# Patient Record
Sex: Male | Born: 1950 | Race: White | Hispanic: No | Marital: Married | Smoking: Current every day smoker
Health system: Southern US, Community
[De-identification: ages and names within clinical notes are randomized; demographics above are authoritative.]

## PROBLEM LIST (undated history)

## (undated) DIAGNOSIS — Z720 Tobacco use: Secondary | ICD-10-CM

## (undated) DIAGNOSIS — F329 Major depressive disorder, single episode, unspecified: Secondary | ICD-10-CM

## (undated) DIAGNOSIS — E785 Hyperlipidemia, unspecified: Secondary | ICD-10-CM

## (undated) DIAGNOSIS — C189 Malignant neoplasm of colon, unspecified: Secondary | ICD-10-CM

## (undated) DIAGNOSIS — I10 Essential (primary) hypertension: Secondary | ICD-10-CM

## (undated) DIAGNOSIS — I639 Cerebral infarction, unspecified: Secondary | ICD-10-CM

## (undated) HISTORY — PX: COLON RESECTION: SHX5231

---
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
## 2019-02-18 DIAGNOSIS — Z720 Tobacco use: Secondary | ICD-10-CM | POA: Diagnosis not present

## 2019-02-18 DIAGNOSIS — R011 Cardiac murmur, unspecified: Secondary | ICD-10-CM | POA: Diagnosis not present

## 2019-02-18 DIAGNOSIS — I10 Essential (primary) hypertension: Secondary | ICD-10-CM | POA: Diagnosis not present

## 2019-02-18 DIAGNOSIS — R197 Diarrhea, unspecified: Secondary | ICD-10-CM

## 2019-02-18 DIAGNOSIS — C189 Malignant neoplasm of colon, unspecified: Secondary | ICD-10-CM

## 2019-02-18 DIAGNOSIS — R079 Chest pain, unspecified: Secondary | ICD-10-CM | POA: Diagnosis not present

## 2019-02-18 DIAGNOSIS — E785 Hyperlipidemia, unspecified: Secondary | ICD-10-CM

## 2019-02-18 DIAGNOSIS — R110 Nausea: Secondary | ICD-10-CM

## 2019-02-19 DIAGNOSIS — I10 Essential (primary) hypertension: Secondary | ICD-10-CM | POA: Diagnosis not present

## 2019-02-19 DIAGNOSIS — Z720 Tobacco use: Secondary | ICD-10-CM | POA: Diagnosis not present

## 2019-02-19 DIAGNOSIS — R197 Diarrhea, unspecified: Secondary | ICD-10-CM | POA: Diagnosis not present

## 2019-02-19 DIAGNOSIS — R110 Nausea: Secondary | ICD-10-CM | POA: Diagnosis not present

## 2019-03-02 DIAGNOSIS — N138 Other obstructive and reflux uropathy: Secondary | ICD-10-CM

## 2019-03-02 DIAGNOSIS — A4151 Sepsis due to Escherichia coli [E. coli]: Secondary | ICD-10-CM

## 2019-03-02 DIAGNOSIS — K810 Acute cholecystitis: Secondary | ICD-10-CM | POA: Diagnosis not present

## 2019-03-02 DIAGNOSIS — K8309 Other cholangitis: Secondary | ICD-10-CM | POA: Diagnosis not present

## 2019-03-02 DIAGNOSIS — K8000 Calculus of gallbladder with acute cholecystitis without obstruction: Secondary | ICD-10-CM

## 2019-03-03 DIAGNOSIS — A4151 Sepsis due to Escherichia coli [E. coli]: Secondary | ICD-10-CM | POA: Diagnosis not present

## 2019-03-03 DIAGNOSIS — K810 Acute cholecystitis: Secondary | ICD-10-CM | POA: Diagnosis not present

## 2019-03-03 DIAGNOSIS — K8000 Calculus of gallbladder with acute cholecystitis without obstruction: Secondary | ICD-10-CM | POA: Diagnosis not present

## 2019-03-03 DIAGNOSIS — K8309 Other cholangitis: Secondary | ICD-10-CM | POA: Diagnosis not present

## 2019-03-04 DIAGNOSIS — K8000 Calculus of gallbladder with acute cholecystitis without obstruction: Secondary | ICD-10-CM | POA: Diagnosis not present

## 2019-03-04 DIAGNOSIS — K810 Acute cholecystitis: Secondary | ICD-10-CM | POA: Diagnosis not present

## 2019-03-04 DIAGNOSIS — K8309 Other cholangitis: Secondary | ICD-10-CM | POA: Diagnosis not present

## 2019-03-04 DIAGNOSIS — A4151 Sepsis due to Escherichia coli [E. coli]: Secondary | ICD-10-CM | POA: Diagnosis not present

## 2019-03-05 DIAGNOSIS — A4151 Sepsis due to Escherichia coli [E. coli]: Secondary | ICD-10-CM | POA: Diagnosis not present

## 2019-03-05 DIAGNOSIS — K8000 Calculus of gallbladder with acute cholecystitis without obstruction: Secondary | ICD-10-CM | POA: Diagnosis not present

## 2019-03-05 DIAGNOSIS — K8309 Other cholangitis: Secondary | ICD-10-CM | POA: Diagnosis not present

## 2019-03-05 DIAGNOSIS — K810 Acute cholecystitis: Secondary | ICD-10-CM | POA: Diagnosis not present

## 2019-03-06 DIAGNOSIS — A4151 Sepsis due to Escherichia coli [E. coli]: Secondary | ICD-10-CM | POA: Diagnosis not present

## 2019-03-06 DIAGNOSIS — K810 Acute cholecystitis: Secondary | ICD-10-CM | POA: Diagnosis not present

## 2019-03-06 DIAGNOSIS — K8000 Calculus of gallbladder with acute cholecystitis without obstruction: Secondary | ICD-10-CM | POA: Diagnosis not present

## 2019-03-06 DIAGNOSIS — K8309 Other cholangitis: Secondary | ICD-10-CM | POA: Diagnosis not present

## 2019-04-16 ENCOUNTER — Other Ambulatory Visit: Payer: Self-pay

## 2019-04-16 ENCOUNTER — Emergency Department (HOSPITAL_COMMUNITY)
Admission: EM | Admit: 2019-04-16 | Discharge: 2019-04-16 | Disposition: A | Discharge status: Home / Self Care | Payer: Medicare Other | Attending: Emergency Medicine | Admitting: Emergency Medicine

## 2019-04-16 ENCOUNTER — Emergency Department (HOSPITAL_COMMUNITY): Payer: Medicare Other

## 2019-04-16 ENCOUNTER — Encounter (HOSPITAL_COMMUNITY): Payer: Self-pay | Admitting: *Deleted

## 2019-04-16 DIAGNOSIS — J449 Chronic obstructive pulmonary disease, unspecified: Secondary | ICD-10-CM | POA: Insufficient documentation

## 2019-04-16 DIAGNOSIS — Z79899 Other long term (current) drug therapy: Secondary | ICD-10-CM | POA: Insufficient documentation

## 2019-04-16 DIAGNOSIS — R05 Cough: Secondary | ICD-10-CM | POA: Diagnosis present

## 2019-04-16 DIAGNOSIS — R6883 Chills (without fever): Secondary | ICD-10-CM | POA: Diagnosis not present

## 2019-04-16 DIAGNOSIS — J069 Acute upper respiratory infection, unspecified: Secondary | ICD-10-CM | POA: Insufficient documentation

## 2019-04-16 DIAGNOSIS — B9789 Other viral agents as the cause of diseases classified elsewhere: Secondary | ICD-10-CM

## 2019-04-16 DIAGNOSIS — F17210 Nicotine dependence, cigarettes, uncomplicated: Secondary | ICD-10-CM | POA: Diagnosis not present

## 2019-04-16 DIAGNOSIS — M7918 Myalgia, other site: Secondary | ICD-10-CM | POA: Insufficient documentation

## 2019-04-16 DIAGNOSIS — Z7982 Long term (current) use of aspirin: Secondary | ICD-10-CM | POA: Insufficient documentation

## 2019-04-16 DIAGNOSIS — Z720 Tobacco use: Secondary | ICD-10-CM

## 2019-04-16 LAB — BASIC METABOLIC PANEL
Anion gap: 7 (ref 5–15)
BUN: 13 mg/dL (ref 8–23)
CO2: 23 mmol/L (ref 22–32)
Calcium: 8.8 mg/dL — ABNORMAL LOW (ref 8.9–10.3)
Chloride: 108 mmol/L (ref 98–111)
Creatinine, Ser: 1.04 mg/dL (ref 0.61–1.24)
GFR calc Af Amer: >60 mL/min (ref >60)
GFR calc non Af Amer: >60 mL/min (ref >60)
Glucose, Bld: 96 mg/dL (ref 70–99)
Potassium: 3.9 mmol/L (ref 3.5–5.1)
Sodium: 138 mmol/L (ref 135–145)

## 2019-04-16 LAB — CBC WITH DIFFERENTIAL/PLATELET
Abs Immature Granulocytes: 0.02 10*3/uL (ref 0.00–0.07)
Basophils Absolute: 0.1 10*3/uL (ref 0.0–0.1)
Basophils Relative: 1 %
Eosinophils Absolute: 0.2 10*3/uL (ref 0.0–0.5)
Eosinophils Relative: 2 %
HCT: 38.5 % — ABNORMAL LOW (ref 39.0–52.0)
Hemoglobin: 12.5 g/dL — ABNORMAL LOW (ref 13.0–17.0)
Immature Granulocytes: 0 %
Lymphocytes Relative: 31 %
Lymphs Abs: 2.8 10*3/uL (ref 0.7–4.0)
MCH: 31.6 pg (ref 26.0–34.0)
MCHC: 32.5 g/dL (ref 30.0–36.0)
MCV: 97.2 fL (ref 80.0–100.0)
Monocytes Absolute: 1.1 10*3/uL — ABNORMAL HIGH (ref 0.1–1.0)
Monocytes Relative: 13 %
Neutro Abs: 4.7 10*3/uL (ref 1.7–7.7)
Neutrophils Relative %: 53 %
Platelets: 190 10*3/uL (ref 150–400)
RBC: 3.96 MIL/uL — ABNORMAL LOW (ref 4.22–5.81)
RDW: 14.2 % (ref 11.5–15.5)
WBC: 8.8 10*3/uL (ref 4.0–10.5)
nRBC: 0 % (ref 0.0–0.2)

## 2019-04-16 MED ORDER — GUAIFENESIN-CODEINE 100-10 MG/5ML PO SOLN: 5.0000 mL | Freq: Four times a day (QID) | ORAL | 0 refills | Status: DC | PRN

## 2019-04-16 MED ORDER — IBUPROFEN 800 MG PO TABS: 800.0000 mg | ORAL_TABLET | Freq: Once | ORAL | Status: DC

## 2019-04-16 NOTE — ED Provider Notes (Signed)
[facility] EMERGENCY DEPARTMENT Provider Note   CSN: [id number] Arrival date & time: 04/16/19  1725    History   Chief Complaint Chief Complaint  Patient presents with  . Cough    HPI Taylor Salas is a 69 y.o. male.  Productive cough, body aches chills and fever.  Patient complains of 2 days of shaking chills and body aches.  He has had a productive cough.  He is a daily smoker and smokes half a pack a day.  He has a history of COPD and is chronic shortness of breath which has not worsened.  Patient states he has not taken his temperature at home but is wife said he felt "like a hot iron" last night.  He took Tylenol about 2:30 PM today several hours before arriving and is found to be afebrile here.  He denies diarrhea, recent foreign travel, contacts with similar symptoms.  He has not been wearing masks or protective gear during the coronavirus outbreak.     HPI  History reviewed. No pertinent past medical history.  There are no active problems to display for this patient.   History reviewed. No pertinent surgical history.      Home Medications    Prior to Admission medications   Medication Sig Start Date End Date Taking? Authorizing Provider  aspirin EC 81 MG tablet Take 81 mg by mouth daily.   Yes [provider]  busPIRone (BUSPAR) 15 MG tablet Take 15 mg by mouth 3 (three) times daily.   Yes [provider]  escitalopram (LEXAPRO) 20 MG tablet Take 20 mg by mouth daily.   Yes [provider]  gabapentin (NEURONTIN) 300 MG capsule Take 300 mg by mouth 3 (three) times daily.   Yes [provider]  metoprolol tartrate (LOPRESSOR) 25 MG tablet Take 25 mg by mouth 3 (three) times daily.   Yes [provider]  pantoprazole (PROTONIX) 20 MG tablet Take 20 mg by mouth daily.   Yes [provider]  rosuvastatin (CRESTOR) 40 MG tablet Take 40 mg by mouth daily.   Yes [provider]    Family  History History reviewed. No pertinent family history.  Social History Social History   Tobacco Use  . Smoking status: Current Every Day Smoker    Packs/day: 0.50  . Smokeless tobacco: Never Used  Substance Use Topics  . Alcohol use: Not Currently  . Drug use: Not Currently     Allergies   Benadryl [diphenhydramine] and Hydrocodone   Review of Systems Review of Systems Ten systems reviewed and are negative for acute change, except as noted in the HPI.    Physical Exam Updated Vital Signs BP (!) 173/106 (BP Location: Right Arm)   Pulse 63   Temp 98.2 F (36.8 C) (Oral)   Resp 18   Ht 6' (1.829 m)   Wt 95.3 kg   SpO2 96%   BMI 28.48 kg/m   Physical Exam Vitals signs and nursing note reviewed.  Constitutional:      General: He is not in acute distress.    Appearance: He is well-developed. He is not ill-appearing or diaphoretic.  HENT:     Head: Normocephalic and atraumatic.  Eyes:     General: No scleral icterus.    Conjunctiva/sclera: Conjunctivae normal.  Neck:     Musculoskeletal: Normal range of motion and neck supple.  Cardiovascular:     Rate and Rhythm: Normal rate and regular rhythm.  Heart sounds: Normal heart sounds.  Pulmonary:     Effort: Pulmonary effort is normal. No respiratory distress.     Breath sounds: No wheezing or rhonchi.  Abdominal:     Palpations: Abdomen is soft.     Tenderness: There is no abdominal tenderness.  Skin:    General: Skin is warm and dry.  Neurological:     Mental Status: He is alert.  Psychiatric:        Behavior: Behavior normal.      ED Treatments / Results  Labs (all labs ordered are listed, but only abnormal results are displayed) Labs Reviewed  CBC WITH DIFFERENTIAL/PLATELET - Abnormal; Notable for the following components:      Result Value   RBC 3.96 (*)    Hemoglobin 12.5 (*)    HCT 38.5 (*)    Monocytes Absolute 1.1 (*)    All other components within normal limits  BASIC METABOLIC PANEL -  Abnormal; Notable for the following components:   Calcium 8.8 (*)    All other components within normal limits    EKG None  Radiology Dg Chest Port 1 View  Result Date: 04/16/2019 CLINICAL DATA:  Fever and cough for 3 days. Smoker. Hypertension. EXAM: PORTABLE CHEST 1 VIEW COMPARISON:  None. FINDINGS: Bilateral shoulder arthroplasty. Hyperinflation. Midline trachea. Normal heart size. Tortuous thoracic aorta. No pleural effusion or pneumothorax. Suspect mild subsegmental atelectasis or scar at the left lung base. No lobar consolidation. IMPRESSION: Hyperinflation, without acute disease. Electronically Signed   By:  Miyamoto M.D.   On: 04/16/2019 19:18    Procedures Procedures (including critical care time)  Medications Ordered in ED Medications  ibuprofen (ADVIL) tablet 800 mg (has no administration in time range)     Initial Impression / Assessment and Plan / ED Course  I have reviewed the triage vital signs and the nursing notes.  Pertinent labs & imaging results that were available during my care of the patient were reviewed by me and considered in my medical decision making (see chart for details).         The patient was counseled on the dangers of tobacco use, and was advised to quit. Reviewed strategies to maximize success, including removing cigarettes and smoking materials from environment, stress management, substitution of other forms of reinforcement, support of family/friends and written materials.  Taylor Salas was evaluated in Emergency Department on 04/16/2019 for the symptoms described in the history of present illness. He was evaluated in the context of the global COVID-19 pandemic, which necessitated consideration that the patient might be at risk for infection with the SARS-CoV-2 virus that causes COVID-19. Institutional protocols and algorithms that pertain to the evaluation of patients at risk for COVID-19 are in a state of rapid change based on information  released by regulatory bodies including the CDC and federal and state organizations. These policies and algorithms were followed during the patient's care in the ED.  CC: Cough and objective fever VS: BP (!) 176/94   Pulse 63   Temp 98.2 F (36.8 C) (Oral)   Resp 18   Ht 6' (1.829 m)   Wt 95.3 kg   SpO2 96%   BMI 28.48 kg/m   EG:BTDVVOH is gathered by patient and his wife. YWV:[id number] diagnosis for emergent cause of cough includes but is not limited to upper respiratory infection, lower respiratory infection, allergies, asthma, irritants, foreign body, medications such as ACE inhibitors, reflux, asthma, CHF, lung cancer, interstitial lung disease, psychiatric causes, postnasal drip  and postinfectious bronchospasm. Labs: I reviewed the labs which show mild normocytic anemia, mild hypocalcemia.  Hest x-ray without acute abnormalities.  Imaging: I personally reviewed the images (chest x-ray) which show(s) no acute abnormalities EKG N/A MDM: Patient with subjective fever and cough.  No evidence of fever, tachycardia or leukocytosis.  Patient is not hypoxic and unlabored here in the emergency department.  Do not think that he needs corona testing but will need to self isolate at home. Patient disposition: Discharge Patient condition: Hydro. The patient appears reasonably screened and/or stabilized for discharge and I doubt any other medical condition or other [facility] requiring further screening, evaluation, or treatment in the ED at this time prior to discharge. I have discussed lab and/or imaging findings with the patient and answered all questions/concerns to the best of my ability. I have discussed return precautions and OP follow up.     Final Clinical Impressions(s) / ED Diagnoses   Final diagnoses:  None    ED Discharge Orders    None       Margarita Mail, PA-C 04/16/19 2255    Lajean Saver, MD 04/19/19 6606    Lajean Saver, MD 04/19/19 [phone]

## 2019-04-16 NOTE — Discharge Instructions (Addendum)
Contact a health care provider if: Your symptoms last for 10 days or longer. Your symptoms get worse over time. You have a fever. You have severe sinus pain in your face or forehead. The glands in your jaw or neck become very swollen. Get help right away if you: Feel pain or pressure in your chest. Have shortness of breath. Faint or feel like you will faint. Have severe and persistent vomiting. Feel confused or disoriented.   Person Under Monitoring Name: Taylor Salas  Location: [address] [place] [address]   Infection Prevention Recommendations for Individuals Confirmed to have, or Being Evaluated for, 2019 Novel Coronavirus (COVID-19) Infection Who Receive Care at Home  Individuals who are confirmed to have, or are being evaluated for, COVID-19 should follow the prevention steps below until a healthcare provider or local or state health department says they can return to normal activities.  Stay home except to get medical care You should restrict activities outside your home, except for getting medical care. Do not go to work, school, or public areas, and do not use public transportation or taxis.  Call ahead before visiting your doctor Before your medical appointment, call the healthcare provider and tell them that you have, or are being evaluated for, COVID-19 infection. This will help the healthcare providers office take steps to keep other people from getting infected. Ask your healthcare provider to call the local or state health department.  Monitor your symptoms Seek prompt medical attention if your illness is worsening (e.g., difficulty breathing). Before going to your medical appointment, call the healthcare provider and tell them that you have, or are being evaluated for, COVID-19 infection. Ask your healthcare provider to call the local or state health department.  Wear a facemask You should wear a facemask that covers your nose and mouth when you are in  the same room with other people and when you visit a healthcare provider. People who live with or visit you should also wear a facemask while they are in the same room with you.  Separate yourself from other people in your home As much as possible, you should stay in a different room from other people in your home. Also, you should use a separate bathroom, if available.  Avoid sharing household items You should not share dishes, drinking glasses, cups, eating utensils, towels, bedding, or other items with other people in your home. After using these items, you should wash them thoroughly with soap and water.  Cover your coughs and sneezes Cover your mouth and nose with a tissue when you cough or sneeze, or you can cough or sneeze into your sleeve. Throw used tissues in a lined trash can, and immediately wash your hands with soap and water for at least 20 seconds or use an alcohol-based hand rub.  Wash your [facility] your hands often and thoroughly with soap and water for at least 20 seconds. You can use an alcohol-based hand sanitizer if soap and water are not available and if your hands are not visibly dirty. Avoid touching your eyes, nose, and mouth with unwashed hands.   Prevention Steps for Caregivers and Household Members of Individuals Confirmed to have, or Being Evaluated for, COVID-19 Infection Being Cared for in the Home  If you live with, or provide care at home for, a person confirmed to have, or being evaluated for, COVID-19 infection please follow these guidelines to prevent infection:  Follow healthcare providers instructions Make sure that you understand and can  help the patient follow any healthcare provider instructions for all care.  Provide for the patients basic needs You should help the patient with basic needs in the home and provide support for getting groceries, prescriptions, and other personal needs.  Monitor the patients symptoms If they are getting  sicker, call his or her medical provider and tell them that the patient has, or is being evaluated for, COVID-19 infection. This will help the healthcare providers office take steps to keep other people from getting infected. Ask the healthcare provider to call the local or state health department.  Limit the number of people who have contact with the patient If possible, have only one caregiver for the patient. Other household members should stay in another home or place of residence. If this is not possible, they should stay in another room, or be separated from the patient as much as possible. Use a separate bathroom, if available. Restrict visitors who do not have an essential need to be in the home.  Keep older adults, very young children, and other sick people away from the patient Keep older adults, very young children, and those who have compromised immune systems or chronic health conditions away from the patient. This includes people with chronic heart, lung, or kidney conditions, diabetes, and cancer.  Ensure good ventilation Make sure that shared spaces in the home have good air flow, such as from an air conditioner or an opened window, weather permitting.  Wash your hands often Wash your hands often and thoroughly with soap and water for at least 20 seconds. You can use an alcohol based hand sanitizer if soap and water are not available and if your hands are not visibly dirty. Avoid touching your eyes, nose, and mouth with unwashed hands. Use disposable paper towels to dry your hands. If not available, use dedicated cloth towels and replace them when they become wet.  Wear a facemask and gloves Wear a disposable facemask at all times in the room and gloves when you touch or have contact with the patients blood, body fluids, and/or secretions or excretions, such as sweat, saliva, sputum, nasal mucus, vomit, urine, or feces.  Ensure the mask fits over your nose and mouth tightly,  and do not touch it during use. Throw out disposable facemasks and gloves after using them. Do not reuse. Wash your hands immediately after removing your facemask and gloves. If your personal clothing becomes contaminated, carefully remove clothing and launder. Wash your hands after handling contaminated clothing. Place all used disposable facemasks, gloves, and other waste in a lined container before disposing them with other household waste. Remove gloves and wash your hands immediately after handling these items.  Do not share dishes, glasses, or other household items with the patient Avoid sharing household items. You should not share dishes, drinking glasses, cups, eating utensils, towels, bedding, or other items with a patient who is confirmed to have, or being evaluated for, COVID-19 infection. After the person uses these items, you should wash them thoroughly with soap and water.  Wash laundry thoroughly Immediately remove and wash clothes or bedding that have blood, body fluids, and/or secretions or excretions, such as sweat, saliva, sputum, nasal mucus, vomit, urine, or feces, on them. Wear gloves when handling laundry from the patient. Read and follow directions on labels of laundry or clothing items and detergent. In general, wash and dry with the warmest temperatures recommended on the label.  Clean all areas the individual has used often Clean all touchable  surfaces, such as counters, tabletops, doorknobs, bathroom fixtures, toilets, phones, keyboards, tablets, and bedside tables, every day. Also, clean any surfaces that may have blood, body fluids, and/or secretions or excretions on them. Wear gloves when cleaning surfaces the patient has come in contact with. Use a diluted bleach solution (e.g., dilute bleach with 1 part bleach and 10 parts water) or a household disinfectant with a label that says EPA-registered for coronaviruses. To make a bleach solution at home, add 1 tablespoon  of bleach to 1 quart (4 cups) of water. For a larger supply, add  cup of bleach to 1 gallon (16 cups) of water. Read labels of cleaning products and follow recommendations provided on product labels. Labels contain instructions for safe and effective use of the cleaning product including precautions you should take when applying the product, such as wearing gloves or eye protection and making sure you have good ventilation during use of the product. Remove gloves and wash hands immediately after cleaning.  Monitor yourself for signs and symptoms of illness Caregivers and household members are considered close contacts, should monitor their health, and will be asked to limit movement outside of the home to the extent possible. Follow the monitoring steps for close contacts listed on the symptom monitoring form.   ? If you have additional questions, contact your local health department or call the epidemiologist on call at [phone] (available 24/7). ? This guidance is subject to change. For the most up-to-date guidance from [facility], please refer to their website: [email or web address]

## 2019-04-16 NOTE — ED Triage Notes (Signed)
PT reports He and his wife went to [place] 8 days ago to the [employer] . Pt reports he does not wear a mask.

## 2019-04-16 NOTE — ED Triage Notes (Signed)
PT reports 3-4 days of cough ,fever and sore throat. Pt reports he took tylenol 2-3 hrs before coming to ED. Pt oral temp 98.2 during triage.

## 2019-04-16 NOTE — ED Notes (Signed)
"  Pt alert and oriented x4. Verbalized understanding of discharge instructions. No acute distress noted.

## 2019-04-16 NOTE — ED Triage Notes (Signed)
Unable obtain  Blood  Call to lab tech called to assist.

## 2019-05-23 DIAGNOSIS — R109 Unspecified abdominal pain: Secondary | ICD-10-CM

## 2019-05-23 DIAGNOSIS — K529 Noninfective gastroenteritis and colitis, unspecified: Secondary | ICD-10-CM

## 2019-05-24 DIAGNOSIS — K529 Noninfective gastroenteritis and colitis, unspecified: Secondary | ICD-10-CM | POA: Diagnosis not present

## 2019-05-24 DIAGNOSIS — R109 Unspecified abdominal pain: Secondary | ICD-10-CM | POA: Diagnosis not present

## 2019-05-25 DIAGNOSIS — K529 Noninfective gastroenteritis and colitis, unspecified: Secondary | ICD-10-CM | POA: Diagnosis not present

## 2019-05-25 DIAGNOSIS — R109 Unspecified abdominal pain: Secondary | ICD-10-CM | POA: Diagnosis not present

## 2019-08-11 ENCOUNTER — Inpatient Hospital Stay (HOSPITAL_COMMUNITY)
Admission: AD | Admit: 2019-08-11 | Discharge: 2019-08-13 | DRG: 286 | Disposition: A | Discharge status: Home / Self Care | Payer: Medicare Other | Source: Other Acute Inpatient Hospital | Attending: Internal Medicine | Admitting: Internal Medicine

## 2019-08-11 ENCOUNTER — Encounter (HOSPITAL_COMMUNITY)
Admission: AD | Disposition: A | Discharge status: Home / Self Care | Payer: Self-pay | Source: Other Acute Inpatient Hospital | Attending: Internal Medicine

## 2019-08-11 ENCOUNTER — Encounter (HOSPITAL_COMMUNITY): Payer: Self-pay | Admitting: Family Medicine

## 2019-08-11 DIAGNOSIS — Z888 Allergy status to other drugs, medicaments and biological substances status: Secondary | ICD-10-CM

## 2019-08-11 DIAGNOSIS — Z79899 Other long term (current) drug therapy: Secondary | ICD-10-CM | POA: Diagnosis not present

## 2019-08-11 DIAGNOSIS — Y92230 Patient room in hospital as the place of occurrence of the external cause: Secondary | ICD-10-CM | POA: Diagnosis not present

## 2019-08-11 DIAGNOSIS — G8929 Other chronic pain: Secondary | ICD-10-CM | POA: Diagnosis present

## 2019-08-11 DIAGNOSIS — R0609 Other forms of dyspnea: Secondary | ICD-10-CM | POA: Diagnosis not present

## 2019-08-11 DIAGNOSIS — I4519 Other right bundle-branch block: Secondary | ICD-10-CM | POA: Diagnosis not present

## 2019-08-11 DIAGNOSIS — Z20828 Contact with and (suspected) exposure to other viral communicable diseases: Secondary | ICD-10-CM | POA: Diagnosis not present

## 2019-08-11 DIAGNOSIS — T50915A Adverse effect of multiple unspecified drugs, medicaments and biological substances, initial encounter: Secondary | ICD-10-CM | POA: Diagnosis not present

## 2019-08-11 DIAGNOSIS — Z7982 Long term (current) use of aspirin: Secondary | ICD-10-CM | POA: Diagnosis not present

## 2019-08-11 DIAGNOSIS — E669 Obesity, unspecified: Secondary | ICD-10-CM | POA: Diagnosis present

## 2019-08-11 DIAGNOSIS — L711 Rhinophyma: Secondary | ICD-10-CM | POA: Diagnosis present

## 2019-08-11 DIAGNOSIS — I214 Non-ST elevation (NSTEMI) myocardial infarction: Secondary | ICD-10-CM | POA: Diagnosis present

## 2019-08-11 DIAGNOSIS — Z6829 Body mass index (BMI) 29.0-29.9, adult: Secondary | ICD-10-CM

## 2019-08-11 DIAGNOSIS — G4733 Obstructive sleep apnea (adult) (pediatric): Secondary | ICD-10-CM | POA: Diagnosis present

## 2019-08-11 DIAGNOSIS — Z8249 Family history of ischemic heart disease and other diseases of the circulatory system: Secondary | ICD-10-CM | POA: Diagnosis not present

## 2019-08-11 DIAGNOSIS — E785 Hyperlipidemia, unspecified: Secondary | ICD-10-CM | POA: Diagnosis present

## 2019-08-11 DIAGNOSIS — I2510 Atherosclerotic heart disease of native coronary artery without angina pectoris: Principal | ICD-10-CM | POA: Diagnosis present

## 2019-08-11 DIAGNOSIS — Z8673 Personal history of transient ischemic attack (TIA), and cerebral infarction without residual deficits: Secondary | ICD-10-CM | POA: Diagnosis not present

## 2019-08-11 DIAGNOSIS — Z9114 Patient's other noncompliance with medication regimen: Secondary | ICD-10-CM | POA: Diagnosis not present

## 2019-08-11 DIAGNOSIS — F419 Anxiety disorder, unspecified: Secondary | ICD-10-CM | POA: Diagnosis not present

## 2019-08-11 DIAGNOSIS — I440 Atrioventricular block, first degree: Secondary | ICD-10-CM | POA: Diagnosis not present

## 2019-08-11 DIAGNOSIS — F17210 Nicotine dependence, cigarettes, uncomplicated: Secondary | ICD-10-CM | POA: Diagnosis not present

## 2019-08-11 DIAGNOSIS — Z885 Allergy status to narcotic agent status: Secondary | ICD-10-CM

## 2019-08-11 DIAGNOSIS — R079 Chest pain, unspecified: Secondary | ICD-10-CM | POA: Diagnosis present

## 2019-08-11 DIAGNOSIS — Z825 Family history of asthma and other chronic lower respiratory diseases: Secondary | ICD-10-CM | POA: Diagnosis not present

## 2019-08-11 DIAGNOSIS — R0789 Other chest pain: Secondary | ICD-10-CM | POA: Diagnosis not present

## 2019-08-11 DIAGNOSIS — I69351 Hemiplegia and hemiparesis following cerebral infarction affecting right dominant side: Secondary | ICD-10-CM

## 2019-08-11 DIAGNOSIS — D649 Anemia, unspecified: Secondary | ICD-10-CM | POA: Diagnosis not present

## 2019-08-11 DIAGNOSIS — F329 Major depressive disorder, single episode, unspecified: Secondary | ICD-10-CM | POA: Diagnosis not present

## 2019-08-11 DIAGNOSIS — I10 Essential (primary) hypertension: Secondary | ICD-10-CM | POA: Diagnosis not present

## 2019-08-11 DIAGNOSIS — G9341 Metabolic encephalopathy: Secondary | ICD-10-CM | POA: Diagnosis present

## 2019-08-11 DIAGNOSIS — Z9181 History of falling: Secondary | ICD-10-CM | POA: Diagnosis not present

## 2019-08-11 DIAGNOSIS — G92 Toxic encephalopathy: Secondary | ICD-10-CM | POA: Diagnosis not present

## 2019-08-11 DIAGNOSIS — F32A Depression, unspecified: Secondary | ICD-10-CM

## 2019-08-11 DIAGNOSIS — Z886 Allergy status to analgesic agent status: Secondary | ICD-10-CM

## 2019-08-11 DIAGNOSIS — I200 Unstable angina: Secondary | ICD-10-CM

## 2019-08-11 HISTORY — DX: Hyperlipidemia, unspecified: E78.5

## 2019-08-11 HISTORY — DX: Malignant neoplasm of colon, unspecified: C18.9

## 2019-08-11 HISTORY — DX: Depression, unspecified: F32.A

## 2019-08-11 HISTORY — DX: Essential (primary) hypertension: I10

## 2019-08-11 HISTORY — DX: Cerebral infarction, unspecified: I63.9

## 2019-08-11 HISTORY — PX: LEFT HEART CATH AND CORONARY ANGIOGRAPHY: CATH118249

## 2019-08-11 HISTORY — DX: Tobacco use: Z72.0

## 2019-08-11 LAB — COMPREHENSIVE METABOLIC PANEL
ALT: 13 U/L (ref 0–44)
AST: 15 U/L (ref 15–41)
Albumin: 3.3 g/dL — ABNORMAL LOW (ref 3.5–5.0)
Alkaline Phosphatase: 61 U/L (ref 38–126)
Anion gap: 9 (ref 5–15)
BUN: 15 mg/dL (ref 8–23)
CO2: 25 mmol/L (ref 22–32)
Calcium: 8.5 mg/dL — ABNORMAL LOW (ref 8.9–10.3)
Chloride: 105 mmol/L (ref 98–111)
Creatinine, Ser: 0.99 mg/dL (ref 0.61–1.24)
GFR calc Af Amer: >60 mL/min (ref >60)
GFR calc non Af Amer: >60 mL/min (ref >60)
Glucose, Bld: 121 mg/dL — ABNORMAL HIGH (ref 70–99)
Potassium: 3.9 mmol/L (ref 3.5–5.1)
Sodium: 139 mmol/L (ref 135–145)
Total Bilirubin: 0.7 mg/dL (ref 0.3–1.2)
Total Protein: 5.7 g/dL — ABNORMAL LOW (ref 6.5–8.1)

## 2019-08-11 LAB — CBC WITH DIFFERENTIAL/PLATELET
Abs Immature Granulocytes: 0.11 10*3/uL — ABNORMAL HIGH (ref 0.00–0.07)
Basophils Absolute: 0.1 10*3/uL (ref 0.0–0.1)
Basophils Relative: 0 %
Eosinophils Absolute: 0.2 10*3/uL (ref 0.0–0.5)
Eosinophils Relative: 1 %
HCT: 34.2 % — ABNORMAL LOW (ref 39.0–52.0)
Hemoglobin: 11.4 g/dL — ABNORMAL LOW (ref 13.0–17.0)
Immature Granulocytes: 1 %
Lymphocytes Relative: 14 %
Lymphs Abs: 2.2 10*3/uL (ref 0.7–4.0)
MCH: 32.9 pg (ref 26.0–34.0)
MCHC: 33.3 g/dL (ref 30.0–36.0)
MCV: 98.8 fL (ref 80.0–100.0)
Monocytes Absolute: 1 10*3/uL (ref 0.1–1.0)
Monocytes Relative: 7 %
Neutro Abs: 12.3 10*3/uL — ABNORMAL HIGH (ref 1.7–7.7)
Neutrophils Relative %: 77 %
Platelets: 205 10*3/uL (ref 150–400)
RBC: 3.46 MIL/uL — ABNORMAL LOW (ref 4.22–5.81)
RDW: 14.2 % (ref 11.5–15.5)
WBC: 15.8 10*3/uL — ABNORMAL HIGH (ref 4.0–10.5)
nRBC: 0 % (ref 0.0–0.2)

## 2019-08-11 LAB — TROPONIN I (HIGH SENSITIVITY)
Troponin I (High Sensitivity): 5 ng/L (ref <18)
Troponin I (High Sensitivity): 5 ng/L (ref <18)

## 2019-08-11 LAB — HIV ANTIBODY (ROUTINE TESTING W REFLEX): HIV Screen 4th Generation wRfx: NONREACTIVE

## 2019-08-11 LAB — HEPARIN LEVEL (UNFRACTIONATED): Heparin Unfractionated: 0.87 IU/mL — ABNORMAL HIGH (ref 0.30–0.70)

## 2019-08-11 LAB — SARS CORONAVIRUS 2 BY RT PCR (HOSPITAL ORDER, PERFORMED IN ~~LOC~~ HOSPITAL LAB): SARS Coronavirus 2: NEGATIVE

## 2019-08-11 LAB — MAGNESIUM: Magnesium: 1.9 mg/dL (ref 1.7–2.4)

## 2019-08-11 SURGERY — LEFT HEART CATH AND CORONARY ANGIOGRAPHY
Anesthesia: LOCAL

## 2019-08-11 MED ORDER — METOPROLOL TARTRATE 25 MG PO TABS
25.0000 mg | ORAL_TABLET | Freq: Three times a day (TID) | ORAL | Status: DC
  Administered 2019-08-11 – 2019-08-13 (×7): 25 mg via ORAL
  Filled 2019-08-11 (×7): qty 1

## 2019-08-11 MED ORDER — SODIUM CHLORIDE 0.9 % IV SOLN: 250.0000 mL | INTRAVENOUS | Status: DC | PRN

## 2019-08-11 MED ORDER — SODIUM CHLORIDE 0.9% FLUSH: 3.0000 mL | Freq: Two times a day (BID) | INTRAVENOUS | Status: DC

## 2019-08-11 MED ORDER — FLUTICASONE FUROATE-VILANTEROL 100-25 MCG/INH IN AEPB
1.0000 | INHALATION_SPRAY | Freq: Every day | RESPIRATORY_TRACT | Status: DC
  Administered 2019-08-12 – 2019-08-13 (×2): 1 via RESPIRATORY_TRACT
  Filled 2019-08-11: qty 28

## 2019-08-11 MED ORDER — OXYCODONE-ACETAMINOPHEN 5-325 MG PO TABS
1.0000 | ORAL_TABLET | Freq: Four times a day (QID) | ORAL | Status: DC
  Administered 2019-08-11 – 2019-08-12 (×3): 1 via ORAL
  Filled 2019-08-11 (×3): qty 1

## 2019-08-11 MED ORDER — DOXYLAMINE SUCCINATE (SLEEP) 25 MG PO TABS
12.5000 mg | ORAL_TABLET | Freq: Every evening | ORAL | Status: DC | PRN
  Filled 2019-08-11: qty 1

## 2019-08-11 MED ORDER — OXYCODONE-ACETAMINOPHEN 10-325 MG PO TABS: 1.0000 | ORAL_TABLET | Freq: Four times a day (QID) | ORAL | Status: DC

## 2019-08-11 MED ORDER — HEPARIN BOLUS VIA INFUSION
4000.0000 [IU] | Freq: Once | INTRAVENOUS | Status: DC
  Filled 2019-08-11: qty 4000

## 2019-08-11 MED ORDER — TRAZODONE HCL 100 MG PO TABS
200.0000 mg | ORAL_TABLET | Freq: Every day | ORAL | Status: DC
  Administered 2019-08-11: 200 mg via ORAL
  Filled 2019-08-11: qty 2

## 2019-08-11 MED ORDER — FLUTICASONE-UMECLIDIN-VILANT 100-62.5-25 MCG/INH IN AEPB: 1.0000 | INHALATION_SPRAY | Freq: Every day | RESPIRATORY_TRACT | Status: DC

## 2019-08-11 MED ORDER — TRAZODONE HCL 50 MG PO TABS: 50.0000 mg | ORAL_TABLET | Freq: Once | ORAL | Status: DC

## 2019-08-11 MED ORDER — ACETAMINOPHEN 325 MG PO TABS
650.0000 mg | ORAL_TABLET | ORAL | Status: DC | PRN
  Administered 2019-08-11 (×2): 650 mg via ORAL
  Filled 2019-08-11 (×2): qty 2

## 2019-08-11 MED ORDER — PHENYLEPH-DOXYLAMINE-DM-APAP 5-6.25-10-325 MG/15ML PO LIQD: 15.0000 mL | Freq: Every evening | ORAL | Status: DC | PRN

## 2019-08-11 MED ORDER — IOHEXOL 350 MG/ML SOLN
INTRAVENOUS | Status: DC | PRN
  Administered 2019-08-11: 60 mL via INTRAVENOUS

## 2019-08-11 MED ORDER — MORPHINE SULFATE (PF) 2 MG/ML IV SOLN
1.0000 mg | INTRAVENOUS | Status: DC | PRN
  Administered 2019-08-11: 3 mg via INTRAVENOUS
  Administered 2019-08-11: 2 mg via INTRAVENOUS
  Administered 2019-08-11 (×2): 3 mg via INTRAVENOUS
  Filled 2019-08-11: qty 1
  Filled 2019-08-11 (×3): qty 2

## 2019-08-11 MED ORDER — FENTANYL CITRATE (PF) 100 MCG/2ML IJ SOLN
INTRAMUSCULAR | Status: DC | PRN
  Administered 2019-08-11: 50 ug via INTRAVENOUS

## 2019-08-11 MED ORDER — ACETAMINOPHEN 325 MG PO TABS: 325.0000 mg | ORAL_TABLET | Freq: Every evening | ORAL | Status: DC | PRN

## 2019-08-11 MED ORDER — ESCITALOPRAM OXALATE 10 MG PO TABS
20.0000 mg | ORAL_TABLET | Freq: Every day | ORAL | Status: DC
  Administered 2019-08-11 – 2019-08-12 (×2): 20 mg via ORAL
  Filled 2019-08-11 (×3): qty 2

## 2019-08-11 MED ORDER — ALBUTEROL SULFATE (2.5 MG/3ML) 0.083% IN NEBU: 2.5000 mg | INHALATION_SOLUTION | Freq: Four times a day (QID) | RESPIRATORY_TRACT | Status: DC | PRN

## 2019-08-11 MED ORDER — NICOTINE 14 MG/24HR TD PT24
14.0000 mg | MEDICATED_PATCH | Freq: Every day | TRANSDERMAL | Status: DC
  Administered 2019-08-11 – 2019-08-13 (×3): 14 mg via TRANSDERMAL
  Filled 2019-08-11 (×4): qty 1

## 2019-08-11 MED ORDER — SODIUM CHLORIDE 0.9 % WEIGHT BASED INFUSION
3.0000 mL/kg/h | INTRAVENOUS | Status: DC
  Administered 2019-08-11: 14:00:00 3 mL/kg/h via INTRAVENOUS

## 2019-08-11 MED ORDER — SODIUM CHLORIDE 0.9% FLUSH: 3.0000 mL | INTRAVENOUS | Status: DC | PRN

## 2019-08-11 MED ORDER — SODIUM CHLORIDE 0.9% FLUSH
3.0000 mL | Freq: Two times a day (BID) | INTRAVENOUS | Status: DC
  Administered 2019-08-11 – 2019-08-13 (×4): 3 mL via INTRAVENOUS

## 2019-08-11 MED ORDER — MORPHINE SULFATE (PF) 2 MG/ML IV SOLN
1.0000 mg | INTRAVENOUS | Status: DC | PRN
  Administered 2019-08-11: 1 mg via INTRAVENOUS
  Administered 2019-08-11: 2 mg via INTRAVENOUS
  Filled 2019-08-11 (×2): qty 1

## 2019-08-11 MED ORDER — LIDOCAINE HCL (PF) 1 % IJ SOLN
INTRAMUSCULAR | Status: DC | PRN
  Administered 2019-08-11: 2 mL via INTRADERMAL

## 2019-08-11 MED ORDER — ONDANSETRON HCL 4 MG/2ML IJ SOLN
4.0000 mg | Freq: Four times a day (QID) | INTRAMUSCULAR | Status: DC | PRN
  Administered 2019-08-11: 4 mg via INTRAVENOUS
  Filled 2019-08-11: qty 2

## 2019-08-11 MED ORDER — BUSPIRONE HCL 5 MG PO TABS
15.0000 mg | ORAL_TABLET | Freq: Three times a day (TID) | ORAL | Status: DC
  Administered 2019-08-11 – 2019-08-12 (×5): 15 mg via ORAL
  Filled 2019-08-11 (×6): qty 3

## 2019-08-11 MED ORDER — LOSARTAN POTASSIUM 50 MG PO TABS
50.0000 mg | ORAL_TABLET | Freq: Every day | ORAL | Status: DC
  Administered 2019-08-11 – 2019-08-12 (×2): 50 mg via ORAL
  Filled 2019-08-11 (×2): qty 1

## 2019-08-11 MED ORDER — PANTOPRAZOLE SODIUM 20 MG PO TBEC
20.0000 mg | DELAYED_RELEASE_TABLET | Freq: Every day | ORAL | Status: DC
  Administered 2019-08-11 – 2019-08-13 (×3): 20 mg via ORAL
  Filled 2019-08-11 (×3): qty 1

## 2019-08-11 MED ORDER — NITROGLYCERIN IN D5W 200-5 MCG/ML-% IV SOLN
0.0000 ug/min | INTRAVENOUS | Status: DC
  Administered 2019-08-11: 10 ug/min via INTRAVENOUS

## 2019-08-11 MED ORDER — ACETAMINOPHEN 325 MG PO TABS: 650.0000 mg | ORAL_TABLET | ORAL | Status: DC | PRN

## 2019-08-11 MED ORDER — ASPIRIN EC 81 MG PO TBEC
81.0000 mg | DELAYED_RELEASE_TABLET | Freq: Every day | ORAL | Status: DC
  Administered 2019-08-11 – 2019-08-13 (×3): 81 mg via ORAL
  Filled 2019-08-11 (×3): qty 1

## 2019-08-11 MED ORDER — HEPARIN (PORCINE) 25000 UT/250ML-% IV SOLN
1150.0000 [IU]/h | INTRAVENOUS | Status: DC
  Filled 2019-08-11: qty 250

## 2019-08-11 MED ORDER — ROSUVASTATIN CALCIUM 20 MG PO TABS: 40.0000 mg | ORAL_TABLET | Freq: Every day | ORAL | Status: DC

## 2019-08-11 MED ORDER — SODIUM CHLORIDE 0.9 % WEIGHT BASED INFUSION: 1.0000 mL/kg/h | INTRAVENOUS | Status: DC

## 2019-08-11 MED ORDER — RISPERIDONE 0.5 MG PO TABS
0.5000 mg | ORAL_TABLET | Freq: Every day | ORAL | Status: DC
  Filled 2019-08-11: qty 1

## 2019-08-11 MED ORDER — RISPERIDONE 0.5 MG PO TABS
0.5000 mg | ORAL_TABLET | Freq: Every day | ORAL | Status: DC
  Administered 2019-08-11 – 2019-08-12 (×2): 0.5 mg via ORAL
  Filled 2019-08-11 (×3): qty 1

## 2019-08-11 MED ORDER — HYDRALAZINE HCL 20 MG/ML IJ SOLN: 10.0000 mg | INTRAMUSCULAR | Status: AC | PRN

## 2019-08-11 MED ORDER — HEPARIN SODIUM (PORCINE) 1000 UNIT/ML IJ SOLN
INTRAMUSCULAR | Status: DC | PRN
  Administered 2019-08-11: 5000 [IU] via INTRAVENOUS

## 2019-08-11 MED ORDER — HEPARIN SODIUM (PORCINE) 5000 UNIT/ML IJ SOLN
5000.0000 [IU] | Freq: Three times a day (TID) | INTRAMUSCULAR | Status: DC
  Administered 2019-08-11 – 2019-08-13 (×5): 5000 [IU] via SUBCUTANEOUS
  Filled 2019-08-11 (×5): qty 1

## 2019-08-11 MED ORDER — VERAPAMIL HCL 2.5 MG/ML IV SOLN
INTRAVENOUS | Status: DC | PRN
  Administered 2019-08-11: 10 mL via INTRA_ARTERIAL

## 2019-08-11 MED ORDER — UMECLIDINIUM BROMIDE 62.5 MCG/INH IN AEPB
1.0000 | INHALATION_SPRAY | Freq: Every day | RESPIRATORY_TRACT | Status: DC
  Administered 2019-08-12 – 2019-08-13 (×2): 1 via RESPIRATORY_TRACT
  Filled 2019-08-11: qty 7

## 2019-08-11 MED ORDER — GABAPENTIN 300 MG PO CAPS
300.0000 mg | ORAL_CAPSULE | Freq: Three times a day (TID) | ORAL | Status: DC
  Administered 2019-08-11 – 2019-08-12 (×5): 300 mg via ORAL
  Filled 2019-08-11 (×6): qty 1

## 2019-08-11 MED ORDER — MIDAZOLAM HCL 2 MG/2ML IJ SOLN
INTRAMUSCULAR | Status: DC | PRN
  Administered 2019-08-11: 2 mg via INTRAVENOUS

## 2019-08-11 MED ORDER — ALBUTEROL SULFATE HFA 108 (90 BASE) MCG/ACT IN AERS: 1.0000 | INHALATION_SPRAY | Freq: Four times a day (QID) | RESPIRATORY_TRACT | Status: DC | PRN

## 2019-08-11 MED ORDER — ONDANSETRON HCL 4 MG/2ML IJ SOLN: 4.0000 mg | Freq: Four times a day (QID) | INTRAMUSCULAR | Status: DC | PRN

## 2019-08-11 MED ORDER — ATORVASTATIN CALCIUM 40 MG PO TABS
40.0000 mg | ORAL_TABLET | Freq: Every day | ORAL | Status: DC
  Administered 2019-08-11: 40 mg via ORAL
  Filled 2019-08-11: qty 1

## 2019-08-11 MED ORDER — HEPARIN (PORCINE) IN NACL 1000-0.9 UT/500ML-% IV SOLN
INTRAVENOUS | Status: DC | PRN
  Administered 2019-08-11 (×2): 500 mL

## 2019-08-11 MED ORDER — DEXTROMETHORPHAN POLISTIREX ER 30 MG/5ML PO SUER
10.0000 mg | Freq: Every evening | ORAL | Status: DC | PRN
  Filled 2019-08-11: qty 5

## 2019-08-11 MED ORDER — OXYCODONE HCL 5 MG PO TABS
5.0000 mg | ORAL_TABLET | Freq: Four times a day (QID) | ORAL | Status: DC
  Administered 2019-08-11 – 2019-08-12 (×3): 5 mg via ORAL
  Filled 2019-08-11 (×3): qty 1

## 2019-08-11 MED ORDER — SODIUM CHLORIDE 0.9% FLUSH
3.0000 mL | Freq: Two times a day (BID) | INTRAVENOUS | Status: DC
  Administered 2019-08-11: 02:00:00 3 mL via INTRAVENOUS

## 2019-08-11 MED ORDER — LABETALOL HCL 5 MG/ML IV SOLN: 10.0000 mg | INTRAVENOUS | Status: AC | PRN

## 2019-08-11 MED ORDER — SODIUM CHLORIDE 0.9 % IV SOLN
INTRAVENOUS | Status: AC
  Administered 2019-08-11: 17:00:00 via INTRAVENOUS

## 2019-08-11 MED ORDER — TAMSULOSIN HCL 0.4 MG PO CAPS
0.4000 mg | ORAL_CAPSULE | Freq: Every day | ORAL | Status: DC
  Administered 2019-08-11 – 2019-08-13 (×3): 0.4 mg via ORAL
  Filled 2019-08-11 (×3): qty 1

## 2019-08-11 MED ORDER — NITROGLYCERIN 0.4 MG SL SUBL: 0.4000 mg | SUBLINGUAL_TABLET | SUBLINGUAL | Status: DC | PRN

## 2019-08-11 SURGICAL SUPPLY — 9 items
CATH 5FR JL3.5 JR4 ANG PIG MP (CATHETERS) ×2 IMPLANT
DEVICE RAD COMP TR BAND LRG (VASCULAR PRODUCTS) ×2 IMPLANT
GLIDESHEATH SLEND SS 6F .021 (SHEATH) ×2 IMPLANT
GUIDEWIRE INQWIRE 1.5J.035X260 (WIRE) ×1 IMPLANT
INQWIRE 1.5J .035X260CM (WIRE) ×2
KIT HEART LEFT (KITS) ×2 IMPLANT
PACK CARDIAC CATHETERIZATION (CUSTOM PROCEDURE TRAY) ×2 IMPLANT
TRANSDUCER W/STOPCOCK (MISCELLANEOUS) ×2 IMPLANT
TUBING CIL FLEX 10 FLL-RA (TUBING) ×2 IMPLANT

## 2019-08-11 NOTE — Progress Notes (Signed)
ANTICOAGULATION CONSULT NOTE - Initial Consult  Pharmacy Consult for heparin Indication: chest pain/ACS  Allergies  Allergen Reactions  . Nsaids Rash  . Benadryl [Diphenhydramine] Other (See Comments)    Stops pt from urinating.  . Sulfa Antibiotics   . Hydrocodone Itching and Rash    Patient Measurements: Height: 6' (182.9 cm) Weight: 220 lb 0.3 oz (99.8 kg) IBW/kg (Calculated) : 77.6 Heparin Dosing Weight: 97.8 kg Vital Signs: Temp: 98 F (36.7 C) (08/11 0551) Temp Source: Oral (08/11 0551) BP: 144/90 (08/11 0551) Pulse Rate: 67 (08/11 0551)  Labs: Recent Labs    08/11/19 0135 08/11/19 0320 08/11/19 0717  HGB 11.4*  --   --   HCT 34.2*  --   --   PLT 205  --   --   HEPARINUNFRC  --   --  0.87*  CREATININE 0.99  --   --   TROPONINIHS 5 5  --     Estimated Creatinine Clearance: 88.6 mL/min (by C-G formula based on SCr of 0.99 mg/dL).   Medical History: Past Medical History:  Diagnosis Date  . Depression 08/11/2019  . Essential hypertension 08/11/2019      Assessment: 69 yo man on heparin for CP and r/o ACS.  He is not on anticoagulation PTA -initial heparin level is above goal  Goal of Therapy:  Heparin level 0.3-0.7 units/ml Monitor platelets by anticoagulation protocol: Yes   Plan:  -Decrease heparin to 1150 units/hr -Heparin level in 6 hours and daily wth CBC daily  Hildred Laser, PharmD Clinical Pharmacist **Pharmacist phone directory can now be found on [email or web address] ([email or web address]).  Listed under [facility].

## 2019-08-11 NOTE — H&P (View-Only) (Signed)
Cardiology Consultation:   Patient ID: Christophe Rising; [id number]; September 27, 1950   Admit date: 08/11/2019 Date of Consult: 08/11/2019  Primary Care Provider: [facility] [place] Primary Cardiologist: New to [place]; Dr. Claiborne Billings Primary Electrophysiologist:  None    Patient Profile:   Ellington Greenslade is a 69 y.o. male with a PMH of HTN, HLD, TIA/CVA, depression/anxiety, colon cancer s/p resection, and tobacco abuse, who is being seen today for the evaluation of chest pain at the request of Dr. Ree Kida.  History of Present Illness:   Mr. Hearne was in his usual state of health until developed acute onset severe left-sided chest pain on 08/10/2019 while out shopping. He described the pain as sharp with associated SOB and nausea. No vomiting, diaphoresis, dizziness, lightheadedness, or syncope. He was given SL nitro with resolution of chest pain. He initially presented to [facility]. EKG c/f ischemic changes with submm STD in lateral leads, for which Cardiology (Dr. Bettina Gavia) was curbsided and recommended transfer to [facility] for further evaluation.  His last ischemic evaluation was a NST 01/2019 which was without ischemia. Last echo 09/2018 with normal LV systolic/diastolic function and no significant valvular abnormalities.   Patient, and his wife (at bedside), are poor historian. He reports difficulty with chest pain almost daily but was unable to describe what was different about yesterdays episode that prompted an ER visit. He reports chest pain will often occur when having an argument with his wife. He is relatively inactive at baseline due to chronic back pain. He mentions undergoing a cardiac catheterization in the 1990s to evaluate a "leaky valve", though no further interventions were preformed and there is no mention of valvular abnormalities on echo 09/2018. He he does not recall being told he had any CAD at that time.   He reports that he was recently seen by a cardiologist at [facility] after being referred there by his PCP for atrial fibrillation. Upon further questioning, it is unclear whether there has been evidence of atrial fibrillation on EKGs or if this was suspected following his stroke. He does not recall any discussion regarding blood thinners so I suspect there has not been a formal diagnosis. He reports poorly controlled blood pressure which is typically in the 180s/90s. He was recently prescribed losartan for HTN but has not started this yet. He also reports medication non-compliance, stopping his plavix and statin 3 months ago. He has had some financial difficulties in the past but reports things are stable now. No complaints of orthopnea, LE edema, dizziness, lightheadedness, or syncope. ROS notable for night sweats and abdominal bloating. He reports awaiting cardiology clearance to undergo surveillance EGD/C-scope for colon cancer monitoring.   At the time of this evaluation patient reports 3/10 chest pain, though did not tolerate nitro gtt due to headaches. Risk factors for CAD include poorly controlled HTN, HLD, obesity, tobacco abuse (smoke 0.5-1ppd - interested in quitting), and suspected OSA (snoring, apneic episodes noted by wife, and daytime somnolence).   Hospital course: Hypertensive and bradycardic to the 50s, otherwise VSS. Labs notable for Trop <0/01 x2 at [facility] and high sensitivity trop 5 x5 here; Cr 0.99, electrolytes wnl, WBC 15.8, Hgb 11.4, PLT 205, COVID 19 negative. CXR without acute findings. EKG with sinus rhythm with 1st degree AV block, rate 70 bpm, incomplete RBBB, non-specific ST-T wave abnormalities in inferolateral leads, no STE/D.    Past Medical History:  Diagnosis Date  . Colon cancer ([place])   . CVA (cerebral vascular accident) ([place])   .  Depression 08/11/2019  . Essential hypertension 08/11/2019  . Hyperlipidemia   . Tobacco abuse     Past Surgical History:  Procedure Laterality Date  . COLON RESECTION      resection of colon cancer - unknown location     Home Medications:  Prior to Admission medications   Medication Sig Start Date End Date Taking? Authorizing Provider  aspirin EC 81 MG tablet Take 81 mg by mouth daily.   Yes [provider]  busPIRone (BUSPAR) 15 MG tablet Take 15 mg by mouth 3 (three) times daily.   Yes [provider]  cyclobenzaprine (FLEXERIL) 10 MG tablet Take 10 mg by mouth 3 (three) times daily as needed for muscle spasms. 05/27/19  Yes [provider]  escitalopram (LEXAPRO) 20 MG tablet Take 20 mg by mouth daily.   Yes [provider]  gabapentin (NEURONTIN) 300 MG capsule Take 300 mg by mouth 3 (three) times daily.   Yes [provider]  metoprolol tartrate (LOPRESSOR) 25 MG tablet Take 25 mg by mouth 3 (three) times daily.   Yes [provider]  pantoprazole (PROTONIX) 20 MG tablet Take 20 mg by mouth daily.   Yes [provider]  rosuvastatin (CRESTOR) 40 MG tablet Take 40 mg by mouth daily.   Yes [provider]  tamsulosin (FLOMAX) 0.4 MG CAPS capsule Take 0.4 mg by mouth daily. 10/23/18  Yes [provider]  traZODone (DESYREL) 100 MG tablet Take 200 mg by mouth at bedtime.   Yes [provider]  guaiFENesin-codeine 100-10 MG/5ML syrup Take 5-10 mLs by mouth every 6 (six) hours as needed for cough. 04/16/19   Margarita Mail, PA-C    Inpatient Medications: Scheduled Meds: . aspirin EC  81 mg Oral Daily  . busPIRone  15 mg Oral TID  . escitalopram  20 mg Oral Daily  . gabapentin  300 mg Oral TID  . metoprolol tartrate  25 mg Oral TID  . nicotine  14 mg Transdermal Daily  . pantoprazole  20 mg Oral Daily  . rosuvastatin  40 mg Oral q1800  . sodium chloride flush  3 mL Intravenous Q12H  . traZODone  50 mg Oral Once   Continuous Infusions: . sodium chloride    . heparin 1,150 Units/hr (08/11/19 1032)  . nitroGLYCERIN 10 mcg/min (08/11/19 0800)   PRN Meds: sodium  chloride, acetaminophen, morphine injection, nitroGLYCERIN, ondansetron (ZOFRAN) IV, sodium chloride flush  Allergies:    Allergies  Allergen Reactions  . Nsaids Rash  . Benadryl [Diphenhydramine] Other (See Comments)    Stops pt from urinating.  . Sulfa Antibiotics   . Hydrocodone Itching and Rash    Social History:   Social History   Socioeconomic History  . Marital status: Married    Spouse name: Not on file  . Number of children: Not on file  . Years of education: Not on file  . Highest education level: Not on file  Occupational History  . Occupation: Theme park manager at a [employer]  . Financial resource strain: Not on file  . Food insecurity    Worry: Not on file    Inability: Not on file  . Transportation needs    Medical: Not on file    Non-medical: Not on file  Tobacco Use  . Smoking status: Current Every Day Smoker    Packs/day: 0.50  . Smokeless tobacco: Never Used  Substance and Sexual Activity  . Alcohol use: Not Currently  . Drug  use: Not Currently  . Sexual activity: Not on file  Lifestyle  . Physical activity    Days per week: Not on file    Minutes per session: Not on file  . Stress: Not on file  Relationships  . Social Herbalist on phone: Not on file    Gets together: Not on file    Attends religious service: Not on file    Active member of club or organization: Not on file    Attends meetings of clubs or organizations: Not on file    Relationship status: Not on file  . Intimate partner violence    Fear of current or ex partner: Not on file    Emotionally abused: Not on file    Physically abused: Not on file    Forced sexual activity: Not on file  Other Topics Concern  . Not on file  Social History Narrative  . Not on file    Family History:    Family History  Problem Relation Age of Onset  . COPD Mother   . Heart attack Father        Died in late 41s-early 70s     ROS:  Please see the history of present  illness.   All other ROS reviewed and negative.     Physical Exam/Data:   Vitals:   08/11/19 0104 08/11/19 0551  BP: 130/85 (!) 144/90  Pulse: (!) 53 67  Resp: 18 16  Temp: 97.7 F (36.5 C) 98 F (36.7 C)  TempSrc: Oral Oral  SpO2: 94% 98%  Weight: 99.8 kg   Height: 6' (1.829 m)     Intake/Output Summary (Last 24 hours) at 08/11/2019 1221 Last data filed at 08/11/2019 0700 Gross per 24 hour  Intake 172.68 ml  Output 200 ml  Net -27.32 ml   Filed Weights   08/11/19 0104  Weight: 99.8 kg   Body mass index is 29.84 kg/m.  General:  Well nourished, well developed, laying in bed in no acute distress HEENT: sclera anicteric  Neck: no JVD Endocrine:  No thryomegaly Vascular: No carotid bruits; distal pulses 2+ bilaterally Cardiac:  normal S1, S2; RRR; no murmur  Lungs:  clear to auscultation bilaterally, no wheezing, rhonchi or rales  Abd: NABS, soft, obese, protuberant nontender, no hepatomegaly Ext: no edema Musculoskeletal:  No deformities, BUE and BLE strength normal and equal Skin: warm and dry  Neuro:  CNs 2-12 intact, no focal abnormalities noted Psych:  Normal affect   EKG:  The EKG was personally reviewed and demonstrates:  EKG with sinus rhythm with 1st degree AV block, rate 70 bpm, non-specific ST-T wave abnormalities in inferolateral leads (improved on repeat), no STE/D.   Telemetry:  Telemetry was personally reviewed and demonstrates:  Sinus rhythm with 1st degree AV block and occasional PACs.  Relevant CV Studies: Echocardiogram 09/2018: Summary Normal left ventricle size and systolic function, with no segmental abnormality. Ejection fraction is visually estimated at 72-53% Diastolic function appears normal  NST 01/2019: 1. No reversible ischemia or infarction 2. Normal left ventricular wall motion 3. Left ventricular ejection fraction 50% 4. Non invasive risk stratification*: Low risk  Laboratory Data:  Chemistry Recent Labs  Lab  08/11/19 0135  NA 139  K 3.9  CL 105  CO2 25  GLUCOSE 121*  BUN 15  CREATININE 0.99  CALCIUM 8.5*  GFRNONAA >60  GFRAA >60  ANIONGAP 9    Recent Labs  Lab 08/11/19 0135  PROT 5.7*  ALBUMIN 3.3*  AST 15  ALT 13  ALKPHOS 61  BILITOT 0.7   Hematology Recent Labs  Lab 08/11/19 0135  WBC 15.8*  RBC 3.46*  HGB 11.4*  HCT 34.2*  MCV 98.8  MCH 32.9  MCHC 33.3  RDW 14.2  PLT 205   Cardiac EnzymesNo results for input(s): TROPONINI in the last 168 hours. No results for input(s): TROPIPOC in the last 168 hours.  BNPNo results for input(s): BNP, PROBNP in the last 168 hours.  DDimer No results for input(s): DDIMER in the last 168 hours.  Radiology/Studies:  No results found.  Assessment and Plan:   1. Chest pain: patient transferred from [facility] for ongoing chest pain evaluation. Story somewhat atypical, sharp, constant pain. Prior to this would occur daily, often in the setting of arguments with his wife. Relieved by nitro. Trops negative at both hospitals. EKG with non-specific ST-T wave abnormalities in inferolateral leads. Stress test 01/2019 negative. Significant risk factors for CAD including poorly controlled HTN, HLD, obesity, tobacco abuse, and suspected OSA. Possible he had balanced ischemia on stress test? - Favor definitive evaluation with a LHC to clarify coronary anatomy. The patient understands that risks included but are not limited to stroke (1 in 1000), death (1 in 86), kidney failure [usually temporary] (1 in 500), bleeding (1 in 200), allergic reaction [possibly serious] (1 in 200).  The patient understands and agrees to proceed.  - Will check HgbA1C and lipid panel in AM for risk stratification.  2. HTN: BP poorly controlled with typical home readings in the 180s/90s. Recently prescribed losartan (unknown dose) but had not started yet.  - Continue metoprolol  - Anticipate starting losartan following LHC if Cr stable  3. HLD: wife reports  recent total cholesterol in the 300s. Patient quit taking his crestor 3 months ago due to leg cramps - Will check FLP in AM - Will start atorvastatin 40mg  daily   4. Tobacco abuse: smoking 0.5-1ppd. We discussed health risks of smoking and the importance of quitting. He is interested in chantix at discharge - Continue to encourage smoking cessation - Will defer chantix Rx to primary team at discharge  5. Suspected OSA: wife reports snoring and apneic episodes at night; patient reports daytime somnolence - Would benefit from sleep study at discharge  6. CVA: patient with CVA 09/2018. No clear cause. Does not appear to have undergone a TEE during work-up or carry a diagnosis of atrial fibrillation, though certainly a possibility that he has PAF given his obesity and suspected OSA. He was prescribed plavix but stopped taking this medication 3 months ago.  - Continue aspirin and statin - Could consider an outpatient cardiac monitor at discharge    For questions or updates, please contact [place] Please consult [email or web address] for contact info under Cardiology/STEMI.   Signed, Abigail Butts, PA-C  08/11/2019 12:21 PM [phone]   Patient seen and examined. Agree with assessment and plan.  Mr. Emmons Toth is a 69 year old gentleman who has a history of hypertension, hyperlipidemia, suffered a TIA/CVA while living in [place] approximately 18 months ago.  He apparently had been on Plavix but self discontinued this.  He also has a history of depression anxiety, colon CA status post resection and has a longstanding tobacco history of 51 years.  He has noticed of breath with activity.  Yesterday while at a store developed acute onset of severe left-sided chest pain.  Pain was sharp initially and then had a tightness associated with shortness  of breath and nausea.  He presented to [facility] emergency room and apparently his ECG showed new changes in the lateral leads.  His ECG was seen briefly  by Dr. Bettina Gavia who recommended transfer to [facility] for further evaluation.  Has only has been on IV heparin.  His ECG today independently reviewed by me shows sinus bradycardia at 53 improvement in previous T wave abnormalities.  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  He has thick neck.  There is no wheezing.  Rhythm is regular with 1/6 Solik murmur.  Abdomen soft nontender.  Pulses are adequate.  There is no edema.  Neurologic exam is grossly nonfocal.  With the patient's longstanding tobacco history of 51 years (and longstanding prior 1-1/2 to 2 packs/day, currently 1/2 to 1 pack/day), progressive decline in exertional capacity with recent episode of chest pain yesterday associated with T wave abnormality I have recommended definitive cardiac catheterization.  The patient is n.p.o. and will schedule for later today. I have reviewed the risks, indications, and alternatives to cardiac catheterization, possible angioplasty, and stenting with the patient. Risks include but are not limited to bleeding, infection, vascular injury, stroke, myocardial infection, arrhythmia, kidney injury, radiation-related injury in the case of prolonged fluoroscopy use, emergency cardiac surgery, and death. The patient understands the risks of serious complication is 1-2 in 3546 with diagnostic cardiac cath and 1-2% or less with angioplasty/stenting.  Plan cath later today.  In addition, the patient has a very high likelihood of obstructive sleep apnea by virtue of loud snoring, witnessed apneic events by his wife, and body habitus.  We will need to schedule for outpatient sleep study for further evaluation.   Troy Sine, MD, [facility] 08/11/2019 1:08 PM

## 2019-08-11 NOTE — H&P (Signed)
History and Physical    Jayvyn Haselton LOV:[id number] DOB: 1950-10-13 DOA: 08/11/2019  PCP: [facility] [place]   Patient coming from: Home   Chief Complaint: Chest pain   HPI: Purcell Jungbluth is a 69 y.o. male with medical history significant for history of TIA/CVA, depression, hypertension, and tobacco abuse, now presenting to the emergency department for evaluation of chest pain.  Patient reports that he had suffered a respiratory illness couple weeks ago, but had just recently recovered from that and was having an uneventful day when he developed acute onset of severe left-sided chest pain and dyspnea while he was out shopping.  He has experienced chest pain previously, but this was different.  Pain was localized to just left of center, constant, severe, dull in character, and eventually improving and then resolving with nitroglycerin in the ED.  Patient had a stress test in February 2020 with no reversible ischemia or infarction, normal LV wall motion, LVEF 50%, and this was a low risk study.  [facility] ED Course: Upon arrival to the ED, patient is found to be afebrile, saturating well on room air, and with blood pressure 165/78.  EKG features a sinus rhythm with lateral ST-T abnormalities.  Chest x-ray is notable for mild hyperinflation, but no acute findings.  Chemistry panel is unremarkable and CBC notable for a mild leukocytosis and slight anemia.  Initial troponin is undetectable.  Patient was given 324 mg of aspirin, started on IV heparin infusion, started on nitroglycerin infusion, and cardiology was consulted by the ED physician, recommending admission to [facility] for likely catheterization.  Review of Systems:  All other systems reviewed and apart from HPI, are negative.  Past Medical History:  Diagnosis Date  . Depression 08/11/2019  . Essential hypertension 08/11/2019    History reviewed. No pertinent surgical history.   reports that he has been smoking. He  has been smoking about 0.50 packs per day. He has never used smokeless tobacco. He reports previous alcohol use. He reports previous drug use.  Allergies  Allergen Reactions  . Nsaids Rash  . Benadryl [Diphenhydramine] Other (See Comments)    Stops pt from urinating.  Marland Kitchen Hydrocodone Itching and Rash    History reviewed. No pertinent family history.   Prior to Admission medications   Medication Sig Start Date End Date Taking? Authorizing Provider  aspirin EC 81 MG tablet Take 81 mg by mouth daily.    [provider]  busPIRone (BUSPAR) 15 MG tablet Take 15 mg by mouth 3 (three) times daily.    [provider]  escitalopram (LEXAPRO) 20 MG tablet Take 20 mg by mouth daily.    [provider]  gabapentin (NEURONTIN) 300 MG capsule Take 300 mg by mouth 3 (three) times daily.    [provider]  guaiFENesin-codeine 100-10 MG/5ML syrup Take 5-10 mLs by mouth every 6 (six) hours as needed for cough. 04/16/19   Margarita Mail, PA-C  metoprolol tartrate (LOPRESSOR) 25 MG tablet Take 25 mg by mouth 3 (three) times daily.    [provider]  pantoprazole (PROTONIX) 20 MG tablet Take 20 mg by mouth daily.    [provider]  rosuvastatin (CRESTOR) 40 MG tablet Take 40 mg by mouth daily.    [provider]    Physical Exam: There were no vitals filed for this visit.   Constitutional: NAD, calm  Eyes: PERTLA, lids and conjunctivae normal ENMT: Mucous membranes are moist. Posterior pharynx clear of any exudate or lesions.  Neck: normal, supple, no masses, no thyromegaly Respiratory: clear to auscultation bilaterally, no wheezing, no crackles. No accessory muscle use.  Cardiovascular: S1 & S2 heard, regular rate and rhythm. No extremity edema.   Abdomen: No distension, no tenderness, soft. Bowel sounds normal.  Musculoskeletal: no clubbing / cyanosis. No joint deformity upper and lower extremities.   Skin: no significant rashes,  lesions, ulcers. Warm, dry, well-perfused. Neurologic: CN 2-12 grossly intact. Sensation intact. Strength 5/5 in all 4 limbs.  Psychiatric: Alert and oriented x 3. Pleasant, cooperative.    Labs on Admission: I have personally reviewed following labs and imaging studies  CBC: No results for input(s): WBC, NEUTROABS, HGB, HCT, MCV, PLT in the last 168 hours. Basic Metabolic Panel: No results for input(s): NA, K, CL, CO2, GLUCOSE, BUN, CREATININE, CALCIUM, MG, PHOS in the last 168 hours. GFR: CrCl cannot be calculated (Patient's most recent lab result is older than the maximum 21 days allowed.). Liver Function Tests: No results for input(s): AST, ALT, ALKPHOS, BILITOT, PROT, ALBUMIN in the last 168 hours. No results for input(s): LIPASE, AMYLASE in the last 168 hours. No results for input(s): AMMONIA in the last 168 hours. Coagulation Profile: No results for input(s): INR, PROTIME in the last 168 hours. Cardiac Enzymes: No results for input(s): CKTOTAL, CKMB, CKMBINDEX, TROPONINI in the last 168 hours. BNP (last 3 results) No results for input(s): PROBNP in the last 8760 hours. HbA1C: No results for input(s): HGBA1C in the last 72 hours. CBG: No results for input(s): GLUCAP in the last 168 hours. Lipid Profile: No results for input(s): CHOL, HDL, LDLCALC, TRIG, CHOLHDL, LDLDIRECT in the last 72 hours. Thyroid Function Tests: No results for input(s): TSH, T4TOTAL, FREET4, T3FREE, THYROIDAB in the last 72 hours. Anemia Panel: No results for input(s): VITAMINB12, FOLATE, FERRITIN, TIBC, IRON, RETICCTPCT in the last 72 hours. Urine analysis: No results found for: COLORURINE, APPEARANCEUR, LABSPEC, PHURINE, GLUCOSEU, HGBUR, BILIRUBINUR, KETONESUR, PROTEINUR, UROBILINOGEN, NITRITE, LEUKOCYTESUR Sepsis Labs: @LABRCNTIP (procalcitonin:4,lacticidven:4) )No results found for this or any previous visit (from the past 240 hour(s)).   Radiological Exams on Admission: No results found.  EKG:  Independently reviewed. Sinus rhythm, first degree AV block, PAC's, incomplete RBBB, lateral ST-T abnormalities.   Assessment/Plan   1. NSTEMI  - Presents with acute onset of chest pain and SOB, improved and then resolved with NTG, found to have ST-T abnormalities in lateral leads, initial troponin negative, given ASA 324 and started on IV heparin in ED, and cardiologist at [facility] recommended admission to [facility] for likely cath  - Continue cardiac monitoring, trend troponin, continue IV heparin, ASA, statin, and beta-blocker    2. Hypertension  - BP at goal  - Continue metoprolol as tolerated    3. History of CVA  - Continue ASA and statin   4. Depression  - Continue Buspar and Lexapro   5. Tobacco abuse  - Smokes 0.5-1 ppd, wants to quit but not ready  - Counseled, nicotine patch offered     PPE: Mask, face shield  DVT prophylaxis: IV heparin infusion  Code Status: Full  Family Communication: Discussed with patient  Consults called: Cardiology at [facility] consulted by ED physician  Admission status: Observation    Vianne Bulls, MD [facility] Pager [phone]  If 7PM-7AM, please contact night-coverage [email or web address] Password [email or web address]  08/11/2019, 1:10 AM

## 2019-08-11 NOTE — Consult Note (Addendum)
Cardiology Consultation:   Patient ID: Taylor Salas; [id number]; 06/12/1950   Admit date: 08/11/2019 Date of Consult: 08/11/2019  Primary Care Provider: [facility] Primary Cardiologist: New to [place]; Dr. Claiborne Billings Primary Electrophysiologist:  None    Patient Profile:   Taylor Salas is a 69 y.o. male with a PMH of HTN, HLD, TIA/CVA, depression/anxiety, colon cancer s/p resection, and tobacco abuse, who is being seen today for the evaluation of chest pain at the request of Dr. Ree Kida.  History of Present Illness:   Taylor Salas was in his usual state of health until developed acute onset severe left-sided chest pain on 08/10/2019 while out shopping. He described the pain as sharp with associated SOB and nausea. No vomiting, diaphoresis, dizziness, lightheadedness, or syncope. He was given SL nitro with resolution of chest pain. He initially presented to [facility]. EKG c/f ischemic changes with submm STD in lateral leads, for which Cardiology (Dr. Bettina Gavia) was curbsided and recommended transfer to [facility] for further evaluation.  His last ischemic evaluation was a NST 01/2019 which was without ischemia. Last echo 09/2018 with normal LV systolic/diastolic function and no significant valvular abnormalities.   Patient, and his wife (at bedside), are poor historian. He reports difficulty with chest pain almost daily but was unable to describe what was different about yesterdays episode that prompted an ER visit. He reports chest pain will often occur when having an argument with his wife. He is relatively inactive at baseline due to chronic back pain. He mentions undergoing a cardiac catheterization in the 1990s to evaluate a "leaky valve", though no further interventions were preformed and there is no mention of valvular abnormalities on echo 09/2018. He he does not recall being told he had any CAD at that time.   He reports that he was recently seen by a cardiologist at [facility] after being referred there by his PCP for atrial fibrillation. Upon further questioning, it is unclear whether there has been evidence of atrial fibrillation on EKGs or if this was suspected following his stroke. He does not recall any discussion regarding blood thinners so I suspect there has not been a formal diagnosis. He reports poorly controlled blood pressure which is typically in the 180s/90s. He was recently prescribed losartan for HTN but has not started this yet. He also reports medication non-compliance, stopping his plavix and statin 3 months ago. He has had some financial difficulties in the past but reports things are stable now. No complaints of orthopnea, LE edema, dizziness, lightheadedness, or syncope. ROS notable for night sweats and abdominal bloating. He reports awaiting cardiology clearance to undergo surveillance EGD/C-scope for colon cancer monitoring.   At the time of this evaluation patient reports 3/10 chest pain, though did not tolerate nitro gtt due to headaches. Risk factors for CAD include poorly controlled HTN, HLD, obesity, tobacco abuse (smoke 0.5-1ppd - interested in quitting), and suspected OSA (snoring, apneic episodes noted by wife, and daytime somnolence).   Hospital course: Hypertensive and bradycardic to the 50s, otherwise VSS. Labs notable for Trop <0/01 x2 at [facility] and high sensitivity trop 5 x5 here; Cr 0.99, electrolytes wnl, WBC 15.8, Hgb 11.4, PLT 205, COVID 19 negative. CXR without acute findings. EKG with sinus rhythm with 1st degree AV block, rate 70 bpm, incomplete RBBB, non-specific ST-T wave abnormalities in inferolateral leads, no STE/D.    Past Medical History:  Diagnosis Date  . Colon cancer ([place])   . CVA (cerebral vascular accident) ([place])   .  Depression 08/11/2019  . Essential hypertension 08/11/2019  . Hyperlipidemia   . Tobacco abuse     Past Surgical History:  Procedure Laterality Date  . COLON RESECTION      resection of colon cancer - unknown location     Home Medications:  Prior to Admission medications   Medication Sig Start Date End Date Taking? Authorizing Provider  aspirin EC 81 MG tablet Take 81 mg by mouth daily.   Yes [provider]  busPIRone (BUSPAR) 15 MG tablet Take 15 mg by mouth 3 (three) times daily.   Yes [provider]  cyclobenzaprine (FLEXERIL) 10 MG tablet Take 10 mg by mouth 3 (three) times daily as needed for muscle spasms. 05/27/19  Yes [provider]  escitalopram (LEXAPRO) 20 MG tablet Take 20 mg by mouth daily.   Yes [provider]  gabapentin (NEURONTIN) 300 MG capsule Take 300 mg by mouth 3 (three) times daily.   Yes [provider]  metoprolol tartrate (LOPRESSOR) 25 MG tablet Take 25 mg by mouth 3 (three) times daily.   Yes [provider]  pantoprazole (PROTONIX) 20 MG tablet Take 20 mg by mouth daily.   Yes [provider]  rosuvastatin (CRESTOR) 40 MG tablet Take 40 mg by mouth daily.   Yes [provider]  tamsulosin (FLOMAX) 0.4 MG CAPS capsule Take 0.4 mg by mouth daily. 10/23/18  Yes [provider]  traZODone (DESYREL) 100 MG tablet Take 200 mg by mouth at bedtime.   Yes [provider]  guaiFENesin-codeine 100-10 MG/5ML syrup Take 5-10 mLs by mouth every 6 (six) hours as needed for cough. 04/16/19   Margarita Mail, PA-C    Inpatient Medications: Scheduled Meds: . aspirin EC  81 mg Oral Daily  . busPIRone  15 mg Oral TID  . escitalopram  20 mg Oral Daily  . gabapentin  300 mg Oral TID  . metoprolol tartrate  25 mg Oral TID  . nicotine  14 mg Transdermal Daily  . pantoprazole  20 mg Oral Daily  . rosuvastatin  40 mg Oral q1800  . sodium chloride flush  3 mL Intravenous Q12H  . traZODone  50 mg Oral Once   Continuous Infusions: . sodium chloride    . heparin 1,150 Units/hr (08/11/19 1032)  . nitroGLYCERIN 10 mcg/min (08/11/19 0800)   PRN Meds: sodium  chloride, acetaminophen, morphine injection, nitroGLYCERIN, ondansetron (ZOFRAN) IV, sodium chloride flush  Allergies:    Allergies  Allergen Reactions  . Nsaids Rash  . Benadryl [Diphenhydramine] Other (See Comments)    Stops pt from urinating.  . Sulfa Antibiotics   . Hydrocodone Itching and Rash    Social History:   Social History   Socioeconomic History  . Marital status: Married    Spouse name: Not on file  . Number of children: Not on file  . Years of education: Not on file  . Highest education level: Not on file  Occupational History  . Occupation: Theme park manager at a [employer]  . Financial resource strain: Not on file  . Food insecurity    Worry: Not on file    Inability: Not on file  . Transportation needs    Medical: Not on file    Non-medical: Not on file  Tobacco Use  . Smoking status: Current Every Day Smoker    Packs/day: 0.50  . Smokeless tobacco: Never Used  Substance and Sexual Activity  . Alcohol use: Not Currently  . Drug  use: Not Currently  . Sexual activity: Not on file  Lifestyle  . Physical activity    Days per week: Not on file    Minutes per session: Not on file  . Stress: Not on file  Relationships  . Social Herbalist on phone: Not on file    Gets together: Not on file    Attends religious service: Not on file    Active member of club or organization: Not on file    Attends meetings of clubs or organizations: Not on file    Relationship status: Not on file  . Intimate partner violence    Fear of current or ex partner: Not on file    Emotionally abused: Not on file    Physically abused: Not on file    Forced sexual activity: Not on file  Other Topics Concern  . Not on file  Social History Narrative  . Not on file    Family History:    Family History  Problem Relation Age of Onset  . COPD Mother   . Heart attack Father        Died in late 78s-early 70s     ROS:  Please see the history of present  illness.   All other ROS reviewed and negative.     Physical Exam/Data:   Vitals:   08/11/19 0104 08/11/19 0551  BP: 130/85 (!) 144/90  Pulse: (!) 53 67  Resp: 18 16  Temp: 97.7 F (36.5 C) 98 F (36.7 C)  TempSrc: Oral Oral  SpO2: 94% 98%  Weight: 99.8 kg   Height: 6' (1.829 m)     Intake/Output Summary (Last 24 hours) at 08/11/2019 1221 Last data filed at 08/11/2019 0700 Gross per 24 hour  Intake 172.68 ml  Output 200 ml  Net -27.32 ml   Filed Weights   08/11/19 0104  Weight: 99.8 kg   Body mass index is 29.84 kg/m.  General:  Well nourished, well developed, laying in bed in no acute distress HEENT: sclera anicteric  Neck: no JVD Endocrine:  No thryomegaly Vascular: No carotid bruits; distal pulses 2+ bilaterally Cardiac:  normal S1, S2; RRR; no murmur  Lungs:  clear to auscultation bilaterally, no wheezing, rhonchi or rales  Abd: NABS, soft, obese, protuberant nontender, no hepatomegaly Ext: no edema Musculoskeletal:  No deformities, BUE and BLE strength normal and equal Skin: warm and dry  Neuro:  CNs 2-12 intact, no focal abnormalities noted Psych:  Normal affect   EKG:  The EKG was personally reviewed and demonstrates:  EKG with sinus rhythm with 1st degree AV block, rate 70 bpm, non-specific ST-T wave abnormalities in inferolateral leads (improved on repeat), no STE/D.   Telemetry:  Telemetry was personally reviewed and demonstrates:  Sinus rhythm with 1st degree AV block and occasional PACs.  Relevant CV Studies: Echocardiogram 09/2018: Summary Normal left ventricle size and systolic function, with no segmental abnormality. Ejection fraction is visually estimated at 03-54% Diastolic function appears normal  NST 01/2019: 1. No reversible ischemia or infarction 2. Normal left ventricular wall motion 3. Left ventricular ejection fraction 50% 4. Non invasive risk stratification*: Low risk  Laboratory Data:  Chemistry Recent Labs  Lab  08/11/19 0135  NA 139  K 3.9  CL 105  CO2 25  GLUCOSE 121*  BUN 15  CREATININE 0.99  CALCIUM 8.5*  GFRNONAA >60  GFRAA >60  ANIONGAP 9    Recent Labs  Lab 08/11/19 0135  PROT 5.7*  ALBUMIN 3.3*  AST 15  ALT 13  ALKPHOS 61  BILITOT 0.7   Hematology Recent Labs  Lab 08/11/19 0135  WBC 15.8*  RBC 3.46*  HGB 11.4*  HCT 34.2*  MCV 98.8  MCH 32.9  MCHC 33.3  RDW 14.2  PLT 205   Cardiac EnzymesNo results for input(s): TROPONINI in the last 168 hours. No results for input(s): TROPIPOC in the last 168 hours.  BNPNo results for input(s): BNP, PROBNP in the last 168 hours.  DDimer No results for input(s): DDIMER in the last 168 hours.  Radiology/Studies:  No results found.  Assessment and Plan:   1. Chest pain: patient transferred from [facility] for ongoing chest pain evaluation. Story somewhat atypical, sharp, constant pain. Prior to this would occur daily, often in the setting of arguments with his wife. Relieved by nitro. Trops negative at both hospitals. EKG with non-specific ST-T wave abnormalities in inferolateral leads. Stress test 01/2019 negative. Significant risk factors for CAD including poorly controlled HTN, HLD, obesity, tobacco abuse, and suspected OSA. Possible he had balanced ischemia on stress test? - Favor definitive evaluation with a LHC to clarify coronary anatomy. The patient understands that risks included but are not limited to stroke (1 in 1000), death (1 in 68), kidney failure [usually temporary] (1 in 500), bleeding (1 in 200), allergic reaction [possibly serious] (1 in 200).  The patient understands and agrees to proceed.  - Will check HgbA1C and lipid panel in AM for risk stratification.  2. HTN: BP poorly controlled with typical home readings in the 180s/90s. Recently prescribed losartan (unknown dose) but had not started yet.  - Continue metoprolol  - Anticipate starting losartan following LHC if Cr stable  3. HLD: wife reports  recent total cholesterol in the 300s. Patient quit taking his crestor 3 months ago due to leg cramps - Will check FLP in AM - Will start atorvastatin 40mg  daily   4. Tobacco abuse: smoking 0.5-1ppd. We discussed health risks of smoking and the importance of quitting. He is interested in chantix at discharge - Continue to encourage smoking cessation - Will defer chantix Rx to primary team at discharge  5. Suspected OSA: wife reports snoring and apneic episodes at night; patient reports daytime somnolence - Would benefit from sleep study at discharge  6. CVA: patient with CVA 09/2018. No clear cause. Does not appear to have undergone a TEE during work-up or carry a diagnosis of atrial fibrillation, though certainly a possibility that he has PAF given his obesity and suspected OSA. He was prescribed plavix but stopped taking this medication 3 months ago.  - Continue aspirin and statin - Could consider an outpatient cardiac monitor at discharge    For questions or updates, please contact [place] Please consult [email or web address] for contact info under Cardiology/STEMI.   Signed, Abigail Butts, PA-C  08/11/2019 12:21 PM [phone]   Patient seen and examined. Agree with assessment and plan.  Mr. Taylor Salas is a 68 year old gentleman who has a history of hypertension, hyperlipidemia, suffered a TIA/CVA while living in [place] approximately 18 months ago.  He apparently had been on Plavix but self discontinued this.  He also has a history of depression anxiety, colon CA status post resection and has a longstanding tobacco history of 51 years.  He has noticed of breath with activity.  Yesterday while at a store developed acute onset of severe left-sided chest pain.  Pain was sharp initially and then had a tightness associated with shortness  of breath and nausea.  He presented to [facility] emergency room and apparently his ECG showed new changes in the lateral leads.  His ECG was seen briefly  by Dr. Bettina Gavia who recommended transfer to [facility] for further evaluation.  Has only has been on IV heparin.  His ECG today independently reviewed by me shows sinus bradycardia at 53 improvement in previous T wave abnormalities.  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  He has thick neck.  There is no wheezing.  Rhythm is regular with 1/6 Solik murmur.  Abdomen soft nontender.  Pulses are adequate.  There is no edema.  Neurologic exam is grossly nonfocal.  With the patient's longstanding tobacco history of 51 years (and longstanding prior 1-1/2 to 2 packs/day, currently 1/2 to 1 pack/day), progressive decline in exertional capacity with recent episode of chest pain yesterday associated with T wave abnormality I have recommended definitive cardiac catheterization.  The patient is n.p.o. and will schedule for later today. I have reviewed the risks, indications, and alternatives to cardiac catheterization, possible angioplasty, and stenting with the patient. Risks include but are not limited to bleeding, infection, vascular injury, stroke, myocardial infection, arrhythmia, kidney injury, radiation-related injury in the case of prolonged fluoroscopy use, emergency cardiac surgery, and death. The patient understands the risks of serious complication is 1-2 in 1121 with diagnostic cardiac cath and 1-2% or less with angioplasty/stenting.  Plan cath later today.  In addition, the patient has a very high likelihood of obstructive sleep apnea by virtue of loud snoring, witnessed apneic events by his wife, and body habitus.  We will need to schedule for outpatient sleep study for further evaluation.   Troy Sine, MD, [facility] 08/11/2019 1:08 PM

## 2019-08-11 NOTE — Progress Notes (Signed)
ANTICOAGULATION CONSULT NOTE - Initial Consult  Pharmacy Consult for heparin Indication: chest pain/ACS  Allergies  Allergen Reactions  . Nsaids Rash  . Benadryl [Diphenhydramine] Other (See Comments)    Stops pt from urinating.  Marland Kitchen Hydrocodone Itching and Rash    Patient Measurements: Height: 6' (182.9 cm) Weight: 220 lb 0.3 oz (99.8 kg) IBW/kg (Calculated) : 77.6 Heparin Dosing Weight: 97.8 kg Vital Signs: Temp: 97.7 F (36.5 C) (08/11 0104) Temp Source: Oral (08/11 0104) BP: 130/85 (08/11 0104) Pulse Rate: 53 (08/11 0104)  Labs: No results for input(s): HGB, HCT, PLT, APTT, LABPROT, INR, HEPARINUNFRC, HEPRLOWMOCWT, CREATININE, CKTOTAL, CKMB, TROPONINIHS in the last 72 hours.  CrCl cannot be calculated (Patient's most recent lab result is older than the maximum 21 days allowed.).   Medical History: Past Medical History:  Diagnosis Date  . Depression 08/11/2019  . Essential hypertension 08/11/2019    Medications:  See medications history  Assessment: 69 yo man to start heparin for CP.  He was not on anticoagulation PTA Goal of Therapy:  Heparin level 0.3-0.7 units/ml Monitor platelets by anticoagulation protocol: Yes   Plan:  Heparin 4000 unit bolus and drip at 1350 units/hr Check heparin level 6 hours after start Daily HL and CBC while on heparin Monitor for bleeding complications  Thanks for allowing pharmacy to be a part of this patient's care.  Excell Seltzer, PharmD Clinical Pharmacist  08/11/2019,1:20 AM

## 2019-08-11 NOTE — Progress Notes (Signed)
Patient admitted earlier today by Dr. Christia Reading Opyd. See H&P for details.  69 year old with history of TIA/CVA, hypertension, tobacco abuse, depression, who presented to the emergency department with complaints of chest pain.  Patient had initially gone to [place] and was transferred to [facility].  Patient had a stress test in February 2020 which showed no reversible ischemia or infarction, EF 50%, low risk study.  Troponins have been unremarkable.  Cardiology at [facility] recommended admission to [facility] for cardiac catheterization.  Cardiology has been consulted.  Currently on heparin as well as nitroglycerin drips.  On examination, patient continues to complain chest pain and nausea. Chest pain 3/10, on the left with some left arm involvement, sharp in character and intermittent.  Also complains of headache.  Wean off of nitroglycerin drip.  Continue morphine for pain.  As above, pending cardiology recommendations.   Hadlyn Amero D.O. [facility] If 7PM-7AM, please contact night-coverage [email or web address] 08/11/2019, 10:28 AM

## 2019-08-11 NOTE — Interval H&P Note (Signed)
Cath Lab Visit (complete for each Cath Lab visit)  Clinical Evaluation Leading to the Procedure:   ACS: Yes.    Non-ACS:    Anginal Classification: CCS IV  Anti-ischemic medical therapy: Minimal Therapy (1 class of medications)  Non-Invasive Test Results: No non-invasive testing performed  Prior CABG: No previous CABG      History and Physical Interval Note:  08/11/2019 2:46 PM  Taylor Salas  has presented today for surgery, with the diagnosis of Chest pain.  The various methods of treatment have been discussed with the patient and family. After consideration of risks, benefits and other options for treatment, the patient has consented to  Procedure(s): LEFT HEART CATH AND CORONARY ANGIOGRAPHY (N/A) as a surgical intervention.  The patient's history has been reviewed, patient examined, no change in status, stable for surgery.  I have reviewed the patient's chart and labs.  Questions were answered to the patient's satisfaction.     Larae Grooms

## 2019-08-11 NOTE — Care Management Obs Status (Signed)
[place] NOTIFICATION   Patient Details  Name: Taylor Salas MRN: [id number] Date of Birth: 01-13-1950   Medicare Observation Status Notification Given:  Yes    Bethena Roys, RN 08/11/2019, 4:29 PM

## 2019-08-12 ENCOUNTER — Encounter (HOSPITAL_COMMUNITY): Payer: Self-pay | Admitting: Interventional Cardiology

## 2019-08-12 DIAGNOSIS — G9341 Metabolic encephalopathy: Secondary | ICD-10-CM | POA: Diagnosis not present

## 2019-08-12 DIAGNOSIS — Z6829 Body mass index (BMI) 29.0-29.9, adult: Secondary | ICD-10-CM | POA: Diagnosis not present

## 2019-08-12 DIAGNOSIS — R0789 Other chest pain: Secondary | ICD-10-CM | POA: Diagnosis not present

## 2019-08-12 DIAGNOSIS — D649 Anemia, unspecified: Secondary | ICD-10-CM | POA: Diagnosis present

## 2019-08-12 DIAGNOSIS — Z825 Family history of asthma and other chronic lower respiratory diseases: Secondary | ICD-10-CM | POA: Diagnosis not present

## 2019-08-12 DIAGNOSIS — I69351 Hemiplegia and hemiparesis following cerebral infarction affecting right dominant side: Secondary | ICD-10-CM | POA: Diagnosis not present

## 2019-08-12 DIAGNOSIS — Z7982 Long term (current) use of aspirin: Secondary | ICD-10-CM | POA: Diagnosis not present

## 2019-08-12 DIAGNOSIS — I214 Non-ST elevation (NSTEMI) myocardial infarction: Secondary | ICD-10-CM | POA: Diagnosis not present

## 2019-08-12 DIAGNOSIS — E669 Obesity, unspecified: Secondary | ICD-10-CM | POA: Diagnosis present

## 2019-08-12 DIAGNOSIS — Z9114 Patient's other noncompliance with medication regimen: Secondary | ICD-10-CM | POA: Diagnosis not present

## 2019-08-12 DIAGNOSIS — R079 Chest pain, unspecified: Secondary | ICD-10-CM | POA: Diagnosis present

## 2019-08-12 DIAGNOSIS — Z20828 Contact with and (suspected) exposure to other viral communicable diseases: Secondary | ICD-10-CM | POA: Diagnosis present

## 2019-08-12 DIAGNOSIS — L711 Rhinophyma: Secondary | ICD-10-CM | POA: Diagnosis present

## 2019-08-12 DIAGNOSIS — F419 Anxiety disorder, unspecified: Secondary | ICD-10-CM | POA: Diagnosis present

## 2019-08-12 DIAGNOSIS — Z79899 Other long term (current) drug therapy: Secondary | ICD-10-CM | POA: Diagnosis not present

## 2019-08-12 DIAGNOSIS — G8929 Other chronic pain: Secondary | ICD-10-CM | POA: Diagnosis present

## 2019-08-12 DIAGNOSIS — I2510 Atherosclerotic heart disease of native coronary artery without angina pectoris: Secondary | ICD-10-CM | POA: Diagnosis present

## 2019-08-12 DIAGNOSIS — G4733 Obstructive sleep apnea (adult) (pediatric): Secondary | ICD-10-CM | POA: Diagnosis present

## 2019-08-12 DIAGNOSIS — Z8249 Family history of ischemic heart disease and other diseases of the circulatory system: Secondary | ICD-10-CM | POA: Diagnosis not present

## 2019-08-12 DIAGNOSIS — T50915A Adverse effect of multiple unspecified drugs, medicaments and biological substances, initial encounter: Secondary | ICD-10-CM | POA: Diagnosis not present

## 2019-08-12 DIAGNOSIS — I10 Essential (primary) hypertension: Secondary | ICD-10-CM | POA: Diagnosis present

## 2019-08-12 DIAGNOSIS — E785 Hyperlipidemia, unspecified: Secondary | ICD-10-CM | POA: Diagnosis present

## 2019-08-12 DIAGNOSIS — F329 Major depressive disorder, single episode, unspecified: Secondary | ICD-10-CM | POA: Diagnosis present

## 2019-08-12 DIAGNOSIS — Y92230 Patient room in hospital as the place of occurrence of the external cause: Secondary | ICD-10-CM | POA: Diagnosis not present

## 2019-08-12 DIAGNOSIS — F17210 Nicotine dependence, cigarettes, uncomplicated: Secondary | ICD-10-CM | POA: Diagnosis present

## 2019-08-12 DIAGNOSIS — G92 Toxic encephalopathy: Secondary | ICD-10-CM | POA: Diagnosis not present

## 2019-08-12 DIAGNOSIS — Z9181 History of falling: Secondary | ICD-10-CM | POA: Diagnosis not present

## 2019-08-12 DIAGNOSIS — I440 Atrioventricular block, first degree: Secondary | ICD-10-CM | POA: Diagnosis present

## 2019-08-12 DIAGNOSIS — I4519 Other right bundle-branch block: Secondary | ICD-10-CM | POA: Diagnosis present

## 2019-08-12 LAB — LIPID PANEL
Cholesterol: 150 mg/dL (ref 0–200)
HDL: 40 mg/dL — ABNORMAL LOW (ref >40)
LDL Cholesterol: 93 mg/dL (ref 0–99)
Total CHOL/HDL Ratio: 3.8 RATIO
Triglycerides: 84 mg/dL (ref <150)
VLDL: 17 mg/dL (ref 0–40)

## 2019-08-12 LAB — HEMOGLOBIN A1C
Hgb A1c MFr Bld: 5.6 % (ref 4.8–5.6)
Mean Plasma Glucose: 114.02 mg/dL

## 2019-08-12 LAB — CBC
HCT: 34.7 % — ABNORMAL LOW (ref 39.0–52.0)
Hemoglobin: 11.2 g/dL — ABNORMAL LOW (ref 13.0–17.0)
MCH: 32.8 pg (ref 26.0–34.0)
MCHC: 32.3 g/dL (ref 30.0–36.0)
MCV: 101.8 fL — ABNORMAL HIGH (ref 80.0–100.0)
Platelets: 218 10*3/uL (ref 150–400)
RBC: 3.41 MIL/uL — ABNORMAL LOW (ref 4.22–5.81)
RDW: 14.4 % (ref 11.5–15.5)
WBC: 9.7 10*3/uL (ref 4.0–10.5)
nRBC: 0 % (ref 0.0–0.2)

## 2019-08-12 IMAGING — DX PORTABLE CHEST - 1 VIEW
2 series · 2 of 2 positions shown · non-contrast
Comparison: None.

CLINICAL DATA: Fever and cough for 3 days. Smoker. Hypertension.

EXAM:
PORTABLE CHEST 1 VIEW

[chest ap (1 of 2)]
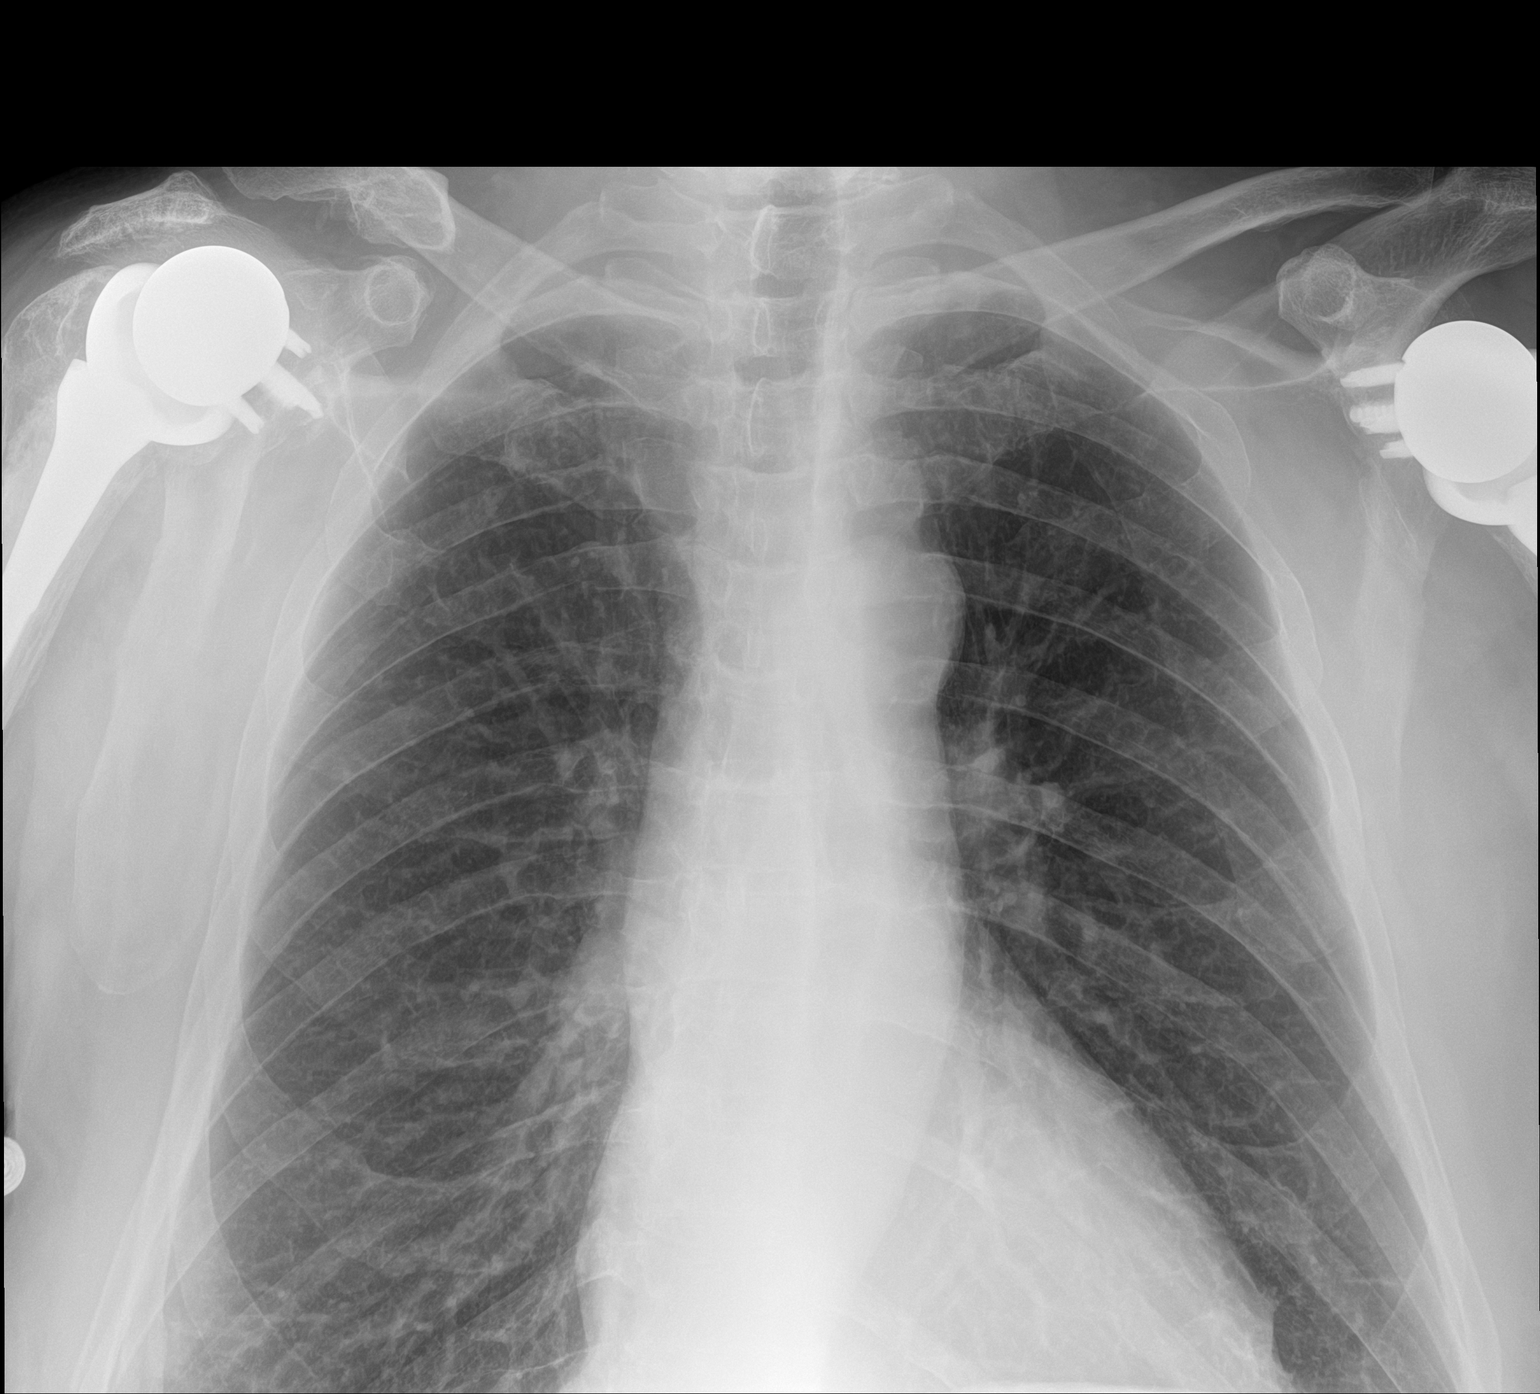

[chest ap (2 of 2)]
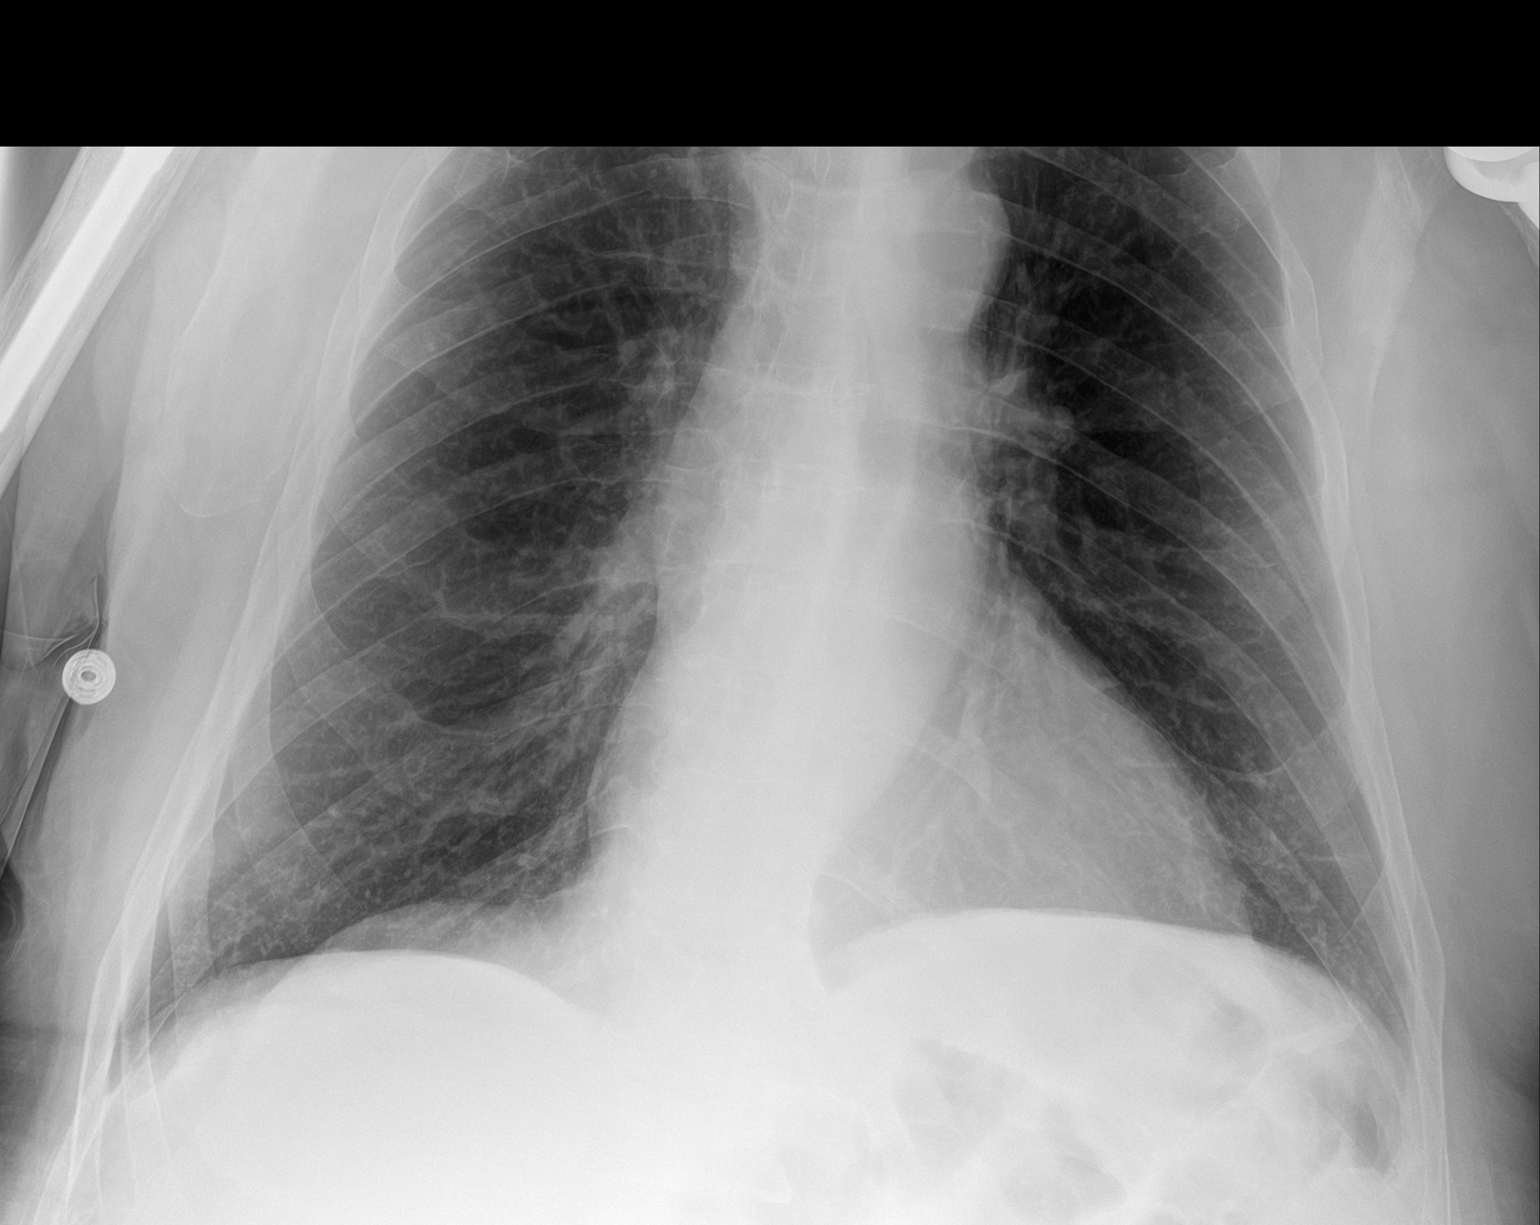

[2 of 2 positions shown; findings below may reference images not displayed]

FINDINGS: Bilateral shoulder arthroplasty. Hyperinflation. Midline trachea.
Normal heart size. Tortuous thoracic aorta. No pleural effusion or
pneumothorax. Suspect mild subsegmental atelectasis or scar at the
left lung base. No lobar consolidation.
IMPRESSION: Hyperinflation, without acute disease.

## 2019-08-12 MED ORDER — ROSUVASTATIN CALCIUM 20 MG PO TABS: 40.0000 mg | ORAL_TABLET | Freq: Every day | ORAL | Status: DC

## 2019-08-12 MED ORDER — MORPHINE SULFATE (PF) 2 MG/ML IV SOLN: 1.0000 mg | Freq: Four times a day (QID) | INTRAVENOUS | Status: DC | PRN

## 2019-08-12 MED ORDER — NICOTINE 14 MG/24HR TD PT24: 14.0000 mg | MEDICATED_PATCH | Freq: Every day | TRANSDERMAL | 0 refills | Status: AC

## 2019-08-12 MED ORDER — ACETAMINOPHEN 325 MG PO TABS
650.0000 mg | ORAL_TABLET | Freq: Four times a day (QID) | ORAL | Status: DC | PRN
  Administered 2019-08-12: 650 mg via ORAL
  Filled 2019-08-12: qty 2

## 2019-08-12 MED ORDER — EZETIMIBE 10 MG PO TABS: 10.0000 mg | ORAL_TABLET | Freq: Every day | ORAL | 0 refills | Status: AC

## 2019-08-12 MED ORDER — DOXEPIN HCL 10 MG PO CAPS
10.0000 mg | ORAL_CAPSULE | Freq: Every day | ORAL | Status: DC
  Administered 2019-08-12: 10 mg via ORAL
  Filled 2019-08-12 (×2): qty 1

## 2019-08-12 MED ORDER — OXYCODONE-ACETAMINOPHEN 5-325 MG PO TABS
1.0000 | ORAL_TABLET | Freq: Three times a day (TID) | ORAL | Status: DC | PRN
  Administered 2019-08-12: 1 via ORAL
  Filled 2019-08-12: qty 1

## 2019-08-12 NOTE — Plan of Care (Signed)
  Problem: Pain Managment: Goal: General experience of comfort will improve Outcome: Progressing Note: PO pain meds effective.

## 2019-08-12 NOTE — Progress Notes (Signed)
PROGRESS NOTE   Taylor Salas  SAY:[id number]    DOB: Nov 25, 1950    DOA: 08/11/2019  PCP: [facility] [place] have briefly reviewed patients previous medical records in [facility].  No chief complaint on file.   Brief Narrative:  69 year old married male, lives with spouse, reportedly ambulates with the help of a cane, PMH of TIA/CVA with residual right hemiparesis, anxiety and depression on polypharmacy, HTN, ongoing tobacco abuse, chronic pain, reports recent fall approximately 4 weeks ago and required sutures to his nose and since then has been "sore all over", presented to ED due to chest pain.  He initially presented to [facility] ED.  He was given aspirin, started on IV heparin and NTG drip, cardiology was consulted by EDP and patient was transferred to [facility] for cardiac Catheterization which he underwent 8/11 that showed nonobstructive CAD.  Cardiology has cleared patient for discharge but patient today with some confusion, gait unsteadiness and high fall risk, likely related to polypharmacy.  Unsafe to discharge home today.   Assessment & Plan:   Principal Problem:   Atypical chest pain Active Problems:   Essential hypertension   Depression   History of CVA (cerebrovascular accident)   Acute metabolic encephalopathy    1. Atypical chest pain: Patient presented with subacute onset of chest pain, dyspnea which resolved with NTG, noted to have ST-T abnormalities in lateral leads, troponins negative and was transferred from [facility] to [facility] for evaluation by cardiology.  Given his symptoms, abnormal EKG with sinus bradycardia and T wave abnormalities, was concerning for coronary ischemia.  Thereby patient underwent cardiac cath on 8/11 which revealed nonobstructive CAD, LVEF and LVEDP both normal, no aortic stenosis.  Cardiology follow-up today appreciated, indicate noncardiac chest pain, no further cardiac work-up recommended, chest pain has resolved and  cardiology has cleared him for discharge home.  Patient to follow-up with his primary Cardiologist as outpatient.  Low index of suspicion for other etiologies i.e. PE or aortic dissection. 2. Mild CAD: Management as indicated above.  Cardiac cath 8/11 as noted above.  25% proximal LAD stenosis noted on cath.  Cardiology recommends statin therapy with LDL goal of <70 mg per DL.  Continue aspirin, beta-blockers and statins. 3. Hyperlipidemia: LDL 93 mg per DL.  Recommended goal <70 mg per DL.  As discussed with Cardiology today, recommend continuing prior home dose of Crestor 40 mg daily and adding Zetia 10 mg daily.  Repeat fasting lipids and liver function tests in 6 to 8 weeks.  Spouse indicates that patient is not very compliant with his Crestor.  Advised compliance. 4. Essential hypertension: Controlled.  Continue prior home regimen including metoprolol 25 mg 3 times daily.  Recently prescription for losartan had been called which patient has not yet picked up.  Since blood pressures are soft at this time, advised discontinuation/not to start until close outpatient follow-up with PCP. 5. History of CVA with residual right hemiparesis: Continue aspirin and statins. 6. Anxiety and depression: Patient on polypharmacy which obviously carries risk of side effects.  He reports that he has been on these medications for some time.  Strongly advised patient and spouse that he closely follow-up with his PCP/psychiatrist along with all the medications to be taken during next visit for review.  No SI/HI or audiovisual hallucinations.  QTC on EKG normal.  As per discussion with spouse, patient was recently started on Risperdal and trazodone was stopped. 7. Tobacco abuse: Cessation counseled.  Continue nicotine patch  at discharge. 8. Overweight/Body mass index is 29.96 kg/m.  High concern for OSA and recommend outpatient evaluation with sleep study. 9. Anemia: Unclear etiology.?  Chronic disease.  Outpatient follow-up  with PCP. 10. Acute toxic metabolic encephalopathy: Likely due to polypharmacy.  Last night patient received trazodone 200 mg, risperidone 0.5 mg and IV morphine and this morning has received Percocet, gabapentin, Lexapro, buspirone.  Although patient was awake and alert this morning and answered questions appropriately, since then as per RN and spouse at bedside report patient became more somnolent, confused.  As per discussion with PT, patient unsteady while evaluation, almost fell and confused.  Minimized several of his contributory medications.  Monitor closely for improvement.   DVT prophylaxis: Subcutaneous heparin Code Status: Full Family Communication: Discussed in detail with patient spouse, updated care and answered questions. Disposition: Patient was admitted as observation status.  From a chest pain standpoint, he is stable for discharge.  However due to acute mental status changes from polypharmacy, patient is unsteady, high fall risk and unsafe to go home.  As per discussion with PT, recommends SNF but patient/family not agreeable.  Thereby minimized several of his medications, monitor closely and reassess in a.m. and hopefully if his mental status changes have cleared, may perform better with PT and could go home.  He is unsafe to discharge home at this point.  Thereby changed to inpatient status   Consultants:  Cardiology  Procedures:  Cardiac cath 8/11  Antimicrobials:  None   Subjective: When seen this morning, patient denies chest pain.  No dyspnea reported.  States that he fell approximately 4 weeks ago after he tripped on something and fell face forward, cut his nose and injured his right big toe.  Since then feels sore all over.  States that he smokes half a pack per day and is willing to quit.  Indicates that he lives with his wife and ambulates with the help of a cane.  Subsequent reports by patient's RN, spouse and PT evaluation showed mental status changes as noted  above.  Objective:  Vitals:   08/11/19 2125 08/12/19 0646 08/12/19 0937 08/12/19 1249  BP: 132/80 (!) 92/52 122/83 99/69  Pulse: 67 71 72 85  Resp: 17 17    Temp: 98.9 F (37.2 C) 98.8 F (37.1 C)  98.4 F (36.9 C)  TempSrc: Oral Oral  Oral  SpO2: 95% 92%  94%  Weight:  100.2 kg    Height:        Examination:  General: Pleasant middle-age male, moderately built and nourished lying comfortably propped up in bed without distress. Cardiovascular: S1 & S2 heard, RRR, S1/S2 +. No murmurs, rubs, gallops or clicks. No JVD or pedal edema.  Telemetry personally reviewed: Sinus bradycardia in the 50s-sinus rhythm. Respiratory: Clear to auscultation without wheezing, rhonchi or crackles. No increased work of breathing.?  Reproducible left-sided anterior chest wall tenderness. Abdominal:  Non distended, non tender & soft. No organomegaly or masses appreciated. Normal bowel sounds heard.  Laparotomy scar +. CNS: Alert and oriented. No focal deficits. Extremities: Bilateral shoulder surgery scars.  States that he has chronically difficulty elevating his arms above his shoulders since surgery.  Distally 5 x 5 power in both upper extremities.  Left lower extremity at least grade 4+ by 5 power.  Right lower extremity at least grade 3 x 5 power but does not seem to be providing adequate cooperation.    Data Reviewed: I have personally reviewed following labs and imaging studies  CBC: Recent Labs  Lab 08/11/19 0135 08/12/19 0516  WBC 15.8* 9.7  NEUTROABS 12.3*  --   HGB 11.4* 11.2*  HCT 34.2* 34.7*  MCV 98.8 101.8*  PLT 205 270   Basic Metabolic Panel: Recent Labs  Lab 08/11/19 0135  NA 139  K 3.9  CL 105  CO2 25  GLUCOSE 121*  BUN 15  CREATININE 0.99  CALCIUM 8.5*  MG 1.9   Liver Function Tests: Recent Labs  Lab 08/11/19 0135  AST 15  ALT 13  ALKPHOS 61  BILITOT 0.7  PROT 5.7*  ALBUMIN 3.3*    Cardiac Enzymes: No results for input(s): CKTOTAL, CKMB, CKMBINDEX,  TROPONINI in the last 168 hours.  CBG: No results for input(s): GLUCAP in the last 168 hours.  Recent Results (from the past 240 hour(s))  SARS Coronavirus 2 [facility] order, Performed in [facility] hospital lab) Nasopharyngeal Nasopharyngeal Swab     Status: None   Collection Time: 08/11/19  1:15 AM   Specimen: Nasopharyngeal Swab  Result Value Ref Range Status   SARS Coronavirus 2 NEGATIVE NEGATIVE Final    Comment: (NOTE) If result is NEGATIVE SARS-CoV-2 target nucleic acids are NOT DETECTED. The SARS-CoV-2 RNA is generally detectable in upper and lower  respiratory specimens during the acute phase of infection. The lowest  concentration of SARS-CoV-2 viral copies this assay can detect is 250  copies / mL. A negative result does not preclude SARS-CoV-2 infection  and should not be used as the sole basis for treatment or other  patient management decisions.  A negative result may occur with  improper specimen collection / handling, submission of specimen other  than nasopharyngeal swab, presence of viral mutation(s) within the  areas targeted by this assay, and inadequate number of viral copies  (<250 copies / mL). A negative result must be combined with clinical  observations, patient history, and epidemiological information. If result is POSITIVE SARS-CoV-2 target nucleic acids are DETECTED. The SARS-CoV-2 RNA is generally detectable in upper and lower  respiratory specimens dur ing the acute phase of infection.  Positive  results are indicative of active infection with SARS-CoV-2.  Clinical  correlation with patient history and other diagnostic information is  necessary to determine patient infection status.  Positive results do  not rule out bacterial infection or co-infection with other viruses. If result is PRESUMPTIVE POSTIVE SARS-CoV-2 nucleic acids MAY BE PRESENT.   A presumptive positive result was obtained on the submitted specimen  and confirmed on repeat testing.   While 2019 novel coronavirus  (SARS-CoV-2) nucleic acids may be present in the submitted sample  additional confirmatory testing may be necessary for epidemiological  and / or clinical management purposes  to differentiate between  SARS-CoV-2 and other Sarbecovirus currently known to infect humans.  If clinically indicated additional testing with an alternate test  methodology [phone]) is advised. The SARS-CoV-2 RNA is generally  detectable in upper and lower respiratory sp ecimens during the acute  phase of infection. The expected result is Negative. Fact Sheet for Patients:  StrictlyIdeas.no Fact Sheet for Healthcare Providers: BankingDealers.co.za This test is not yet approved or cleared by the [place] FDA and has been authorized for detection and/or diagnosis of SARS-CoV-2 by FDA under an Emergency Use Authorization (EUA).  This EUA will remain in effect (meaning this test can be used) for the duration of the COVID-19 declaration under Section 564(b)(1) of the Act, 21 U.S.C. section 360bbb-3(b)(1), unless the authorization is terminated or revoked sooner.  Performed at [facility], [place] [address] [place], [place] [address]          Radiology Studies: No results found.      Scheduled Meds: . aspirin EC  81 mg Oral Daily  . busPIRone  15 mg Oral TID  . escitalopram  20 mg Oral Daily  . fluticasone furoate-vilanterol  1 puff Inhalation Daily   And  . umeclidinium bromide  1 puff Inhalation Daily  . gabapentin  300 mg Oral TID  . heparin  5,000 Units Subcutaneous Q8H  . metoprolol tartrate  25 mg Oral TID  . nicotine  14 mg Transdermal Daily  . pantoprazole  20 mg Oral Daily  . risperiDONE  0.5 mg Oral QHS  . sodium chloride flush  3 mL Intravenous Q12H  . tamsulosin  0.4 mg Oral Daily   Continuous Infusions: . sodium chloride       LOS: 1 day     Vernell Leep, MD, FACP, [facility]. [facility]  To contact the attending provider between 7A-7P or the covering provider during after hours 7P-7A, please log into the web site [email or web address] and access using universal [place] password for that web site. If you do not have the password, please call the hospital operator.  08/12/2019, 2:46 PM

## 2019-08-12 NOTE — Evaluation (Signed)
Physical Therapy Evaluation Patient Details Name: Zavion Sleight MRN: [id number] DOB: [birth year] Today's Date: 08/12/2019   History of Present Illness  Pt is a 69 y/o male admitted secondary to increased chest pain. Thought to have NSTEMI and is s/p L heart cath. L heart cath revealed nonobstructive CAD. PMH includes HTN, TIA/CVA, colon cancer, and tobacco abuse.   Clinical Impression  Pt admitted secondary to problem above with deficits below. Pt very unsteady during gait and only able to tolerate short distance gait. Once out in hall, pt with increased flexion in posture and required max A +2 to remain upright, so chair could be brought for pt to sit in. Feel pt is currently a very high fall risk and unsafe to go home today. Discussed SNF with pt and pt's wife, however, they would like to avoid going to SNF and see if pt able to progress with mobility. Feel pt will benefit from acute PT and another PT session prior to return home to see if able to progress with mobility. Will continue to follow acutely to maximize functional mobility independence and safety.     Follow Up Recommendations SNF;Supervision/Assistance - 24 hour(pt and family likely to refuse; will require max HH services)    Equipment Recommendations  Rolling walker with 5" wheels    Recommendations for Other Services       Precautions / Restrictions Precautions Precautions: Fall Precaution Comments: Pt with near fall during mobility in hall Restrictions Weight Bearing Restrictions: No      Mobility  Bed Mobility               General bed mobility comments: In chair upon entry.   Transfers Overall transfer level: Needs assistance Equipment used: Straight cane Transfers: Sit to/from Stand Sit to Stand: Min assist         General transfer comment: Min A for steadying assist and tactile cues to stand tall. Pt requiring increased time to stand.   Ambulation/Gait Ambulation/Gait assistance: Min assist;Max  assist;+2 physical assistance Gait Distance (Feet): 20 Feet Assistive device: Straight cane;Rolling walker (2 wheeled) Gait Pattern/deviations: Step-through pattern;Decreased stride length;Shuffle;Trunk flexed Gait velocity: Decreased    General Gait Details: Pt with unsteady and shuffle type gait with cane. Gave RW to improve stability, however, pt with difficulty sequencing. Requiring multimodal cues to stand tall and for proximity to device. Once out in hall, pt with increased flexion in posture and unable to stand tall. Pt requiring max A to maintain balance and prevent fall and required RN to bring WC to prevent fall.   Stairs            Wheelchair Mobility    Modified Rankin (Stroke Patients Only)       Balance Overall balance assessment: Needs assistance Sitting-balance support: No upper extremity supported;Feet supported Sitting balance-Leahy Scale: Fair     Standing balance support: Bilateral upper extremity supported;During functional activity Standing balance-Leahy Scale: Poor Standing balance comment: Reliant on BUE and external support.                              Pertinent Vitals/Pain Pain Assessment: Faces Faces Pain Scale: Hurts even more Pain Location: back Pain Descriptors / Indicators: Aching Pain Intervention(s): Limited activity within patient's tolerance;Monitored during session;Repositioned    Home Living Family/patient expects to be discharged to:: Private residence Living Arrangements: Spouse/significant other Available Help at Discharge: Family;Available 24 hours/day Type of Home: House Home Access: Stairs to  enter Entrance Stairs-Rails: None Entrance Stairs-Number of Steps: 2 Home Layout: One level Home Equipment: Tub bench;Cane - single point      Prior Function Level of Independence: Independent with assistive device(s)         Comments: Reports using cane for short distance ambulation. Reports if going to a store,  has to use motorized carts secondary to back pain.      Hand Dominance        Extremity/Trunk Assessment   Upper Extremity Assessment Upper Extremity Assessment: Defer to OT evaluation    Lower Extremity Assessment Lower Extremity Assessment: Generalized weakness    Cervical / Trunk Assessment Cervical / Trunk Assessment: Other exceptions Cervical / Trunk Exceptions: chronic back pain.   Communication   Communication: No difficulties  Cognition Arousal/Alertness: Suspect due to medications Behavior During Therapy: WFL for tasks assessed/performed Overall Cognitive Status: Impaired/Different from baseline Area of Impairment: Problem solving;Safety/judgement;Following commands                       Following Commands: Follows one step commands with increased time;Follows one step commands inconsistently Safety/Judgement: Decreased awareness of safety;Decreased awareness of deficits   Problem Solving: Slow processing General Comments: Pt with notable slowed processing and requiring multimodal cues for safety. Pt not responding to cues occasionally.       General Comments General comments (skin integrity, edema, etc.): Pt's wife in room and reports pt is not at his baseline.     Exercises     Assessment/Plan    PT Assessment Patient needs continued PT services  PT Problem List Decreased strength;Decreased balance;Decreased mobility;Decreased cognition;Decreased knowledge of use of DME;Decreased safety awareness;Decreased knowledge of precautions;Decreased activity tolerance;Pain       PT Treatment Interventions DME instruction;Gait training;Stair training;Functional mobility training;Therapeutic activities;Therapeutic exercise;Balance training;Patient/family education    PT Goals (Current goals can be found in the Care Plan section)  Acute Rehab PT Goals Patient Stated Goal: for pt to be able to walk better prior to return home PT Goal Formulation: With  patient/family Time For Goal Achievement: 08/26/19 Potential to Achieve Goals: Fair    Frequency Min 3X/week   Barriers to discharge        Co-evaluation               AM-PAC PT "6 Clicks" Mobility  Outcome Measure Help needed turning from your back to your side while in a flat bed without using bedrails?: A Little Help needed moving from lying on your back to sitting on the side of a flat bed without using bedrails?: A Little Help needed moving to and from a bed to a chair (including a wheelchair)?: A Little Help needed standing up from a chair using your arms (e.g., wheelchair or bedside chair)?: A Little Help needed to walk in hospital room?: A Lot Help needed climbing 3-5 steps with a railing? : Total 6 Click Score: 15    End of Session Equipment Utilized During Treatment: Gait belt Activity Tolerance: Patient limited by pain Patient left: in chair;with call bell/phone within reach;with family/visitor present Nurse Communication: Mobility status PT Visit Diagnosis: Difficulty in walking, not elsewhere classified (R26.2);Unsteadiness on feet (R26.81);Muscle weakness (generalized) (M62.81)    Time: 1350-1416 PT Time Calculation (min) (ACUTE ONLY): 26 min   Charges:   PT Evaluation $PT Eval Moderate Complexity: 1 Mod PT Treatments $Gait Training: 8-22 mins        Leighton Ruff, PT, DPT  Acute Rehabilitation Services  Pager: [phone])  [phone] Office: [phone]   Rudean Hitt 08/12/2019, 2:59 PM

## 2019-08-12 NOTE — Progress Notes (Addendum)
Progress Note  Patient Name: Taylor Salas Date of Encounter: 08/12/2019  Primary Cardiologist: New to [place]; Dr. Claiborne Billings  Subjective   No complaints this morning. No chest pain.   Inpatient Medications    Scheduled Meds: . aspirin EC  81 mg Oral Daily  . atorvastatin  40 mg Oral q1800  . busPIRone  15 mg Oral TID  . escitalopram  20 mg Oral Daily  . fluticasone furoate-vilanterol  1 puff Inhalation Daily   And  . umeclidinium bromide  1 puff Inhalation Daily  . gabapentin  300 mg Oral TID  . heparin  5,000 Units Subcutaneous Q8H  . losartan  50 mg Oral Daily  . metoprolol tartrate  25 mg Oral TID  . nicotine  14 mg Transdermal Daily  . oxyCODONE-acetaminophen  1 tablet Oral QID   And  . oxyCODONE  5 mg Oral QID  . pantoprazole  20 mg Oral Daily  . risperiDONE  0.5 mg Oral QHS  . sodium chloride flush  3 mL Intravenous Q12H  . tamsulosin  0.4 mg Oral Daily  . traZODone  200 mg Oral QHS  . traZODone  50 mg Oral Once   Continuous Infusions: . sodium chloride    . nitroGLYCERIN 10 mcg/min (08/11/19 0800)   PRN Meds: sodium chloride, doxylamine (Sleep) **AND** acetaminophen **AND** dextromethorphan, acetaminophen, albuterol, morphine injection, nitroGLYCERIN, ondansetron (ZOFRAN) IV, sodium chloride flush   Vital Signs    Vitals:   08/11/19 1845 08/11/19 1855 08/11/19 2125 08/12/19 0646  BP: (!) 165/93 (!) 148/110 132/80 (!) 92/52  Pulse:   67 71  Resp:   17 17  Temp:   98.9 F (37.2 C) 98.8 F (37.1 C)  TempSrc:   Oral Oral  SpO2: 92% 96% 95% 92%  Weight:    100.2 kg  Height:        Intake/Output Summary (Last 24 hours) at 08/12/2019 0854 Last data filed at 08/11/2019 2100 Gross per 24 hour  Intake 259.9 ml  Output 200 ml  Net 59.9 ml   Last 3 Weights 08/12/2019 08/11/2019 04/16/2019  Weight (lbs) 220 lb 14.4 oz 220 lb 0.3 oz 210 lb  Weight (kg) 100.2 kg 99.8 kg 95.255 kg      Telemetry    NSR w/ occasional PVCs, HR in the 70s - Personally  Reviewed  ECG    No new EKGs to review - Personally Reviewed  Physical Exam   GEN: elderly WM in No acute distress.   Neck: No JVD Cardiac: RRR, no murmurs, rubs, or gallops.  Respiratory: Clear to auscultation bilaterally. GI: Soft, nontender, non-distended  MS: No edema; No deformity. Neuro:  Nonfocal  Psych: Normal affect   Labs    High Sensitivity Troponin:   Recent Labs  Lab 08/11/19 0135 08/11/19 0320  TROPONINIHS 5 5      Cardiac EnzymesNo results for input(s): TROPONINI in the last 168 hours. No results for input(s): TROPIPOC in the last 168 hours.   Chemistry Recent Labs  Lab 08/11/19 0135  NA 139  K 3.9  CL 105  CO2 25  GLUCOSE 121*  BUN 15  CREATININE 0.99  CALCIUM 8.5*  PROT 5.7*  ALBUMIN 3.3*  AST 15  ALT 13  ALKPHOS 61  BILITOT 0.7  GFRNONAA >60  GFRAA >60  ANIONGAP 9     Hematology Recent Labs  Lab 08/11/19 0135 08/12/19 0516  WBC 15.8* 9.7  RBC 3.46* 3.41*  HGB 11.4* 11.2*  HCT 34.2*  34.7*  MCV 98.8 101.8*  MCH 32.9 32.8  MCHC 33.3 32.3  RDW 14.2 14.4  PLT 205 218    BNPNo results for input(s): BNP, PROBNP in the last 168 hours.   DDimer No results for input(s): DDIMER in the last 168 hours.   Radiology    No results found.  Cardiac Studies   Echocardiogram 09/2018: Summary Normal left ventricle size and systolic function, with no segmental abnormality. Ejection fraction is visually estimated at 33-38% Diastolic function appears normal  NST 01/2019: 1. No reversible ischemia or infarction 2. Normal left ventricular wall motion 3. Left ventricular ejection fraction 50% 4. Non invasive risk stratification*: Low risk  LHC 08/11/19 Procedures  LEFT HEART CATH AND CORONARY ANGIOGRAPHY  Conclusion    Prox LAD lesion is 25% stenosed.  The left ventricular systolic function is normal.  LV end diastolic pressure is normal.  The left ventricular ejection fraction is 55-65% by visual estimate.  There is no  aortic valve stenosis.   Nonobstructive CAD, mild.  Continue medical therapy.      Left Heart  Left Ventricle The left ventricular size is normal. The left ventricular systolic function is normal. LV end diastolic pressure is normal. The left ventricular ejection fraction is 55-65% by visual estimate. No regional wall motion abnormalities.  Aortic Valve There is no aortic valve stenosis.    Coronary Diagrams  Diagnostic Dominance: Right  Intervention    Patient Profile   Taylor Salas is a 69 y.o. male with a PMH of HTN, HLD, TIA/CVA, depression/anxiety, colon cancer s/p resection and tobacco abuse, who is being seen today for the evaluation of chest pain at the request of Dr. Ree Kida.  Assessment & Plan    1. Chest Pain: symptoms and abnormal EKG w/ sinus bradycardia and T wave abnormalites was concerning for coronary ischemia, however cardiac cath yesterday revealed nonobstructive CAD. LVEF and LVEDP both normal. No aortic stenosis. Hs troponin normal x 2. Non cardia chest pain. No further cardiac w/u. Currently CP free.   2. Mild CAD: 25% prox LAD stenosis noted on cath. Recommend statin therapy w/ LDL goal of < 70 mg. Continue ASA and antihypertensives for BP control.   3. HLD: LDL 93 mg/dL. Recommended goal < 70 mg/dL. He has been on Crestor 40 mg. Would continue and will add Zetia 10 mg to help achieve LDL < 70 mg/dL. Repeat FLP and HFTs in 6-8 weeks.   4. HTN: BP soft this am. 92/52. H/H stable post cath. No significant change. Should hold BP meds for now. Monitor.   No further w/u planned by cardiology. [place] for d/c from our standpoint.   For questions or updates, please contact [place] Please consult [email or web address] for contact info under      Signed, Lyda Jester, PA-C  08/12/2019, 8:54 AM     Patient seen and examined. Agree with assessment and plan. Cath data reviewed. Mild nonobstructive 25% plaque in proximal LAD.  Medical therapy; aggressive lipid  lowering to < 70 to hopefully induce plaque regression.  Add zetia to rosuvastatin. Recommend outpatient sleep study with high likleihood for OSA. R radial site stable. OK to dc from cardiac perspective; consider F/U with Dr. Bettina Gavia in our [place] office.   Troy Sine, MD, [facility] 08/12/2019 9:36 AM

## 2019-08-12 NOTE — Discharge Instructions (Signed)

## 2019-08-13 MED ORDER — OXYCODONE-ACETAMINOPHEN 10-325 MG PO TABS: 1.0000 | ORAL_TABLET | Freq: Three times a day (TID) | ORAL | Status: AC | PRN

## 2019-08-13 NOTE — TOC Initial Note (Signed)
Transition of Care [facility]) - Initial/Assessment Note    Patient Details  Name: Taylor Salas MRN: [id number] Date of Birth: 01/06/1950  Transition [facility]) CM/SW Contact:    Bethena Roys, RN Phone Number: 08/13/2019, 4:02 PM  Clinical Narrative: Pt presented for Chest Pain. PTA from home with support of wife. Plan is to return home- patient declined SNF. Patient has insurance [employer] Pt lives in [place]- difficult to set up based on location and staffing with agencies. Several agencies notified and haven't found one that will accept the patient. Patient and family is aware of not being able to staff. CM has calls placed out to [facility], [facility], [facility] and [facility]. Other agencies that could not accept pt for services are [facility], [facility], [facility], [facility]. CM will continue to follow for additional transition of care needs.                  Expected Discharge Plan: [place] Barriers to Discharge: No Barriers Identified   Patient Goals and CMS Choice Patient states their goals for this hospitalization and ongoing recovery are:: "to return home" CMS Medicare.gov Compare Post Acute Care list provided to:: Patient Choice offered to / list presented to : Patient  Expected Discharge Plan and Services Expected Discharge Plan: [place] In-house Referral: NA Discharge Planning Services: CM Consult Post Acute Care Choice: Home Health   Expected Discharge Date: 08/13/19                         [facility] Arranged: PT          Prior Living Arrangements/Services   Lives with:: Spouse Patient language and need for interpreter reviewed:: Yes Do you feel safe going back to the place where you live?: Yes      Need for Family Participation in Patient Care: Yes (Comment) Care giver support system in place?: Yes (comment)   Criminal Activity/Legal Involvement Pertinent to  Current Situation/Hospitalization: No - Comment as needed  Activities of Daily Living      Permission Sought/Granted Permission sought to share information with : Family Supports                Emotional Assessment Appearance:: Appears older than stated age Attitude/Demeanor/Rapport: Engaged Affect (typically observed): Accepting Orientation: : Oriented to Situation, Oriented to  Time, Oriented to Place, Oriented to Self Alcohol / Substance Use: Not Applicable Psych Involvement: No (comment)  Admission diagnosis:  ACS Patient Active Problem List   Diagnosis Date Noted  . Acute metabolic encephalopathy 70/48/8891  . Atypical chest pain 08/12/2019  . Essential hypertension 08/11/2019  . Depression 08/11/2019  . History of CVA (cerebrovascular accident) 08/11/2019   PCP:  [place]:   [id number] - [place], [address] [address] Phone: [phone] Fax: [phone]     Social Determinants of Health (SDOH) Interventions    Readmission Risk Interventions No flowsheet data found.

## 2019-08-13 NOTE — Discharge Summary (Signed)
Physician Discharge Summary  Taylor Salas GEX:[id number] DOB: Sep 06, 1950  PCP: [facility] [place]  Admitted from: Home Discharged to: Home  Admit date: 08/11/2019 Discharge date: 08/13/2019  Recommendations for Outpatient Follow-up:   [place] [place]. Schedule an appointment as soon as possible for a visit in 1 week(s).   Why: To be seen with repeat labs (CBC & BMP).  Recommend outpatient evaluation for OSA with sleep study. Also follow up with your Psychiatrist. Contact information: [address] [place] [address] [phone]        Richardo Priest, MD. Schedule an appointment as soon as possible for a visit in 2 week(s).   Specialty: Cardiology Contact information: [place] [address] [phone]              Recommend repeating fasting lipids and LFTs in 6 to 8 weeks.  PCP to review patient's medication list in detail, especially his anxiety and depression medications during next office visit.   I discussed in detail with patient and spouse and advised that he follow-up with his psychiatrist as well regarding medication management and he verbalized understanding.   Home Health: PT Equipment/Devices: Rolling walker with 5 inch wheels  Discharge Condition: Improved and stable CODE STATUS: Full Diet recommendation: Heart healthy diet.  Discharge Diagnoses:  Principal Problem:   Atypical chest pain Active Problems:   Essential hypertension   Depression   History of CVA (cerebrovascular accident)   Acute metabolic encephalopathy   Brief Summary: 69 year old married male, lives with spouse, reportedly ambulates with the help of a cane, PMH of TIA/CVA with residual right hemiparesis, anxiety and depression on polypharmacy, HTN, ongoing tobacco abuse, chronic pain, reports recent fall approximately 4 weeks ago and required sutures to his nose and since then has been "sore all over", presented to ED  due to chest pain.  He initially presented to [facility] ED.   He reportedly suffered respiratory illness couple weeks ago, had recently recovered from that and was having an uneventful day when he developed subacute onset of severe left-sided chest pain and dyspnea while he was out shopping.  He experienced chest pain previously but this was different.  Pain was localized to just left of the center, constant, severe, dull in character and eventually improving and then resolving with nitroglycerin in the ED.  He had stress test in February 2020 which showed no reversible ischemia or infarction, normal LV wall motion, LVEF 50% and this was a low risk study.    He was given aspirin, started on IV heparin and NTG drip, Cardiology was consulted by EDP and patient was transferred to [facility] for cardiac Catheterization which he underwent 8/11 that showed nonobstructive CAD.  Cardiology had cleared patient for discharge on 8/12 but patient developed some confusion, gait unsteadiness and high fall risk, likely related to polypharmacy, was unsafe to discharge and hence medications were optimized, he was monitored overnight, PT evaluated today, back to baseline today.   Assessment & Plan:  1. Atypical chest pain:Patient presented with subacute onset of chest pain, dyspnea which resolved with NTG, noted to have ST-T abnormalities in lateral leads, troponins negative and was transferred from [facility] to [facility] for evaluation by Cardiology. Given his symptoms, abnormal EKG with sinus bradycardia and T wave abnormalities, there was concern for coronary ischemia. Thereby patient underwent cardiac cath on 8/11 which revealed nonobstructive CAD, LVEF and LVEDP both normal, no aortic stenosis. Cardiology followed up yesterday, indicated noncardiac  chest pain, no further cardiac work-up recommended, chest pain has resolved and Cardiology has cleared him for discharge home.Patient to follow-up with his primary  Cardiologist as outpatient. Low index of suspicion for other etiologies i.e. PE or aortic dissection, did not appear to be hypoxic or tachycardic on admission.  No recurrence of chest pain. 2. Mild AST:[id number] as indicated above. Cardiac cath 8/11 as noted above. 25% proximal LAD stenosis noted on cath. Cardiology recommends statin therapy with LDL goal of <70 mg per DL. Continue aspirin, beta-blockers and statins. 3. Hyperlipidemia:LDL 93 mg per DL. Recommended goal <70 mg per DL. As discussed with Cardiology, recommend continuing prior home dose of Crestor 40 mg daily and added Zetia 10 mg daily. Repeat fasting lipids and liver function tests in 6 to 8 weeks.Spouse indicates that patient is not very compliant with his Crestor. Advised compliance. 4. Essential hypertension:Controlled on prior home regimen including metoprolol 25 mg 3 times daily. Recently prescription for Losartan had been called which patient has not yet picked up. Since blood pressures were soft yesterday and controlled today on metoprolol alone, discontinued Losartan until close outpatient follow-up with PCP. 5. History of CVA with residual right hemiparesis: Continue aspirin and statins. 6. Anxiety and depression: Patient on polypharmacy which obviously carries risk of side effects. He reports that he has been on these medications for some time. Strongly advisedpatient and spousethat he closely follow-up with his PCP/psychiatristalong with all the medications to be taken during next visit for review. No SI/HI or audiovisual hallucinations. QTC on EKG normal.As per discussion with spouse, patient was recently started on Risperdal and trazodone was stopped.  Patient follows with Psychiatry at [facility]. 7. Tobacco abuse: Cessation counseled and patient wishes to continue nicotine patch at discharge. 8. Overweight/Body mass index is 29.96 kg/m.High concern for OSA and recommend outpatient  evaluation with sleep study. 9. Anemia:Unclear etiology.? Chronic disease. Outpatient follow-up with PCP. 10. Acute toxic metabolic encephalopathy:  Noted 08/12/2019.  Likely due to polypharmacy.  On night prior to event patient had received trazodone 200 mg, risperidone 0.5 mg and IV morphine and yesterday had received Percocet, gabapentin, Lexapro, buspirone.  To the later part of yesterday patient became more somnolent, confused and with PT he was unsteady while evaluation, almost fell and confused.  Minimized several of his contributory medications.  Reassess today and as per discussion with RN, spouse and PT, mental and physical status back to baseline.  Spouse advised that patient does not take Percocet scheduled and she gives it to him as and when needed up to 3 times daily for pain.    Consultants:  Cardiology  Procedures:  Cardiac cath 8/11  Conclusion:   Prox LAD lesion is 25% stenosed.  The left ventricular systolic function is normal.  LV end diastolic pressure is normal.  The left ventricular ejection fraction is 55-65% by visual estimate.  There is no aortic valve stenosis.   Nonobstructive CAD, mild.  Continue medical therapy.    Discharge Instructions  Discharge Instructions    Call MD for:  difficulty breathing, headache or visual disturbances   Complete by: As directed    Call MD for:  extreme fatigue   Complete by: As directed    Call MD for:  persistant dizziness or light-headedness   Complete by: As directed    Call MD for:  persistant nausea and vomiting   Complete by: As directed    Call MD for:  severe uncontrolled pain   Complete by: As  directed    Call MD for:  temperature >100.4   Complete by: As directed    Diet - low sodium heart healthy   Complete by: As directed    Increase activity slowly   Complete by: As directed        Medication List    STOP taking these medications   dicyclomine 10 MG capsule Commonly known as: BENTYL    guaiFENesin-codeine 100-10 MG/5ML syrup   losartan 50 MG tablet Commonly known as: COZAAR   traZODone 100 MG tablet Commonly known as: DESYREL     TAKE these medications   albuterol 108 (90 Base) MCG/ACT inhaler Commonly known as: VENTOLIN HFA Inhale 1 puff into the lungs every 6 (six) hours as needed for wheezing or shortness of breath.   aspirin EC 81 MG tablet Take 81 mg by mouth daily.   busPIRone 15 MG tablet Commonly known as: BUSPAR Take 15 mg by mouth 3 (three) times daily.   cyclobenzaprine 10 MG tablet Commonly known as: FLEXERIL Take 10 mg by mouth 3 (three) times daily as needed for muscle spasms.   doxepin 10 MG capsule Commonly known as: SINEQUAN Take 10 mg by mouth at bedtime.   escitalopram 20 MG tablet Commonly known as: LEXAPRO Take 20 mg by mouth daily.   ezetimibe 10 MG tablet Commonly known as: Zetia Take 1 tablet (10 mg total) by mouth daily.   gabapentin 300 MG capsule Commonly known as: NEURONTIN Take 300 mg by mouth 3 (three) times daily.   metoprolol tartrate 25 MG tablet Commonly known as: LOPRESSOR Take 25 mg by mouth 3 (three) times daily.   nicotine 14 mg/24hr patch Commonly known as: NICODERM CQ - dosed in mg/24 hours Place 1 patch (14 mg total) onto the skin daily.   NyQuil Severe Cold/Flu 5-6.25-10-325 MG/15ML Liqd Generic drug: Phenyleph-Doxylamine-DM-APAP Take 15 mLs by mouth at bedtime as needed (for sleep).   oxyCODONE-acetaminophen 10-325 MG tablet Commonly known as: PERCOCET Take 1 tablet by mouth every 8 (eight) hours as needed (Severe pain.). What changed:   when to take this  reasons to take this   pantoprazole 20 MG tablet Commonly known as: PROTONIX Take 20 mg by mouth daily.   risperiDONE 0.5 MG tablet Commonly known as: RISPERDAL Take 0.5 mg by mouth at bedtime.   rosuvastatin 40 MG tablet Commonly known as: CRESTOR Take 40 mg by mouth daily.   tamsulosin 0.4 MG Caps capsule Commonly known as:  FLOMAX Take 0.4 mg by mouth daily.   Trelegy Ellipta 100-62.5-25 MCG/INH Aepb Generic drug: Fluticasone-Umeclidin-Vilant Inhale 1 puff into the lungs daily.      Allergies  Allergen Reactions  . Nsaids Rash  . Benadryl [Diphenhydramine] Other (See Comments)    Stops pt from urinating.  . Sulfa Antibiotics   . Hydrocodone Itching and Rash      Procedures/Studies: No results found.    Subjective: Patient denies complaints.  No chest pain or pain elsewhere reported.  No dyspnea reported.  Denies dizziness, lightheadedness or palpitations.  No asymmetric leg swelling or pain.  As per RN, no acute issues reported, mental status has improved and back to baseline.  I discussed with spouse who reiterated that she feels he is back to his usual.  I also discussed with physical therapist who indicates that although he is somewhat unsteady, she suspects that it is his baseline and recommends home health therapies along with his wife's supervision.  Patient and spouse continue to decline SNF  anyway.  Discharge Exam:  Vitals:   08/12/19 2019 08/13/19 0449 08/13/19 0857 08/13/19 1112  BP: 116/62 140/83  (!) 146/80  Pulse: 72 61 68 73  Resp: 20  18   Temp: 98.3 F (36.8 C) 98.1 F (36.7 C)    TempSrc: Oral Oral    SpO2: 95% 94% 95%   Weight:  99.2 kg    Height:       General:Pleasant middle-age male, moderately built and nourished lying comfortably propped up in bed without distress. Cardiovascular:S1 &S2 heard, RRR, S1/S2 +. No murmurs, rubs, gallops or clicks. No JVD or pedal edema. Telemetry personally reviewed: Sinus rhythm. Respiratory:Clear to auscultation without wheezing, rhonchi or crackles. No increased work of breathing. Abdominal: Non distended, non tender &soft. No organomegaly or masses appreciated. Normal bowel sounds heard.Laparotomy scar +. CNS: Alert and oriented. No focal deficits. Extremities:Bilateral shoulder surgery scars. States that he has chronic  difficulty elevating his arms above his shoulders since surgery. Distally 5 x 5 power in both upper extremities. Left lower extremity at least grade 4+ by 5 power. Right lower extremity at least grade 4 x 5 power but does not seem to be providing adequate cooperation.    The results of significant diagnostics from this hospitalization (including imaging, microbiology, ancillary and laboratory) are listed below for reference.     Microbiology: Recent Results (from the past 240 hour(s))  SARS Coronavirus 2 [facility] order, Performed in [facility] hospital lab) Nasopharyngeal Nasopharyngeal Swab     Status: None   Collection Time: 08/11/19  1:15 AM   Specimen: Nasopharyngeal Swab  Result Value Ref Range Status   SARS Coronavirus 2 NEGATIVE NEGATIVE Final    Comment: (NOTE) If result is NEGATIVE SARS-CoV-2 target nucleic acids are NOT DETECTED. The SARS-CoV-2 RNA is generally detectable in upper and lower  respiratory specimens during the acute phase of infection. The lowest  concentration of SARS-CoV-2 viral copies this assay can detect is 250  copies / mL. A negative result does not preclude SARS-CoV-2 infection  and should not be used as the sole basis for treatment or other  patient management decisions.  A negative result may occur with  improper specimen collection / handling, submission of specimen other  than nasopharyngeal swab, presence of viral mutation(s) within the  areas targeted by this assay, and inadequate number of viral copies  (<250 copies / mL). A negative result must be combined with clinical  observations, patient history, and epidemiological information. If result is POSITIVE SARS-CoV-2 target nucleic acids are DETECTED. The SARS-CoV-2 RNA is generally detectable in upper and lower  respiratory specimens dur ing the acute phase of infection.  Positive  results are indicative of active infection with SARS-CoV-2.  Clinical  correlation with patient history and  other diagnostic information is  necessary to determine patient infection status.  Positive results do  not rule out bacterial infection or co-infection with other viruses. If result is PRESUMPTIVE POSTIVE SARS-CoV-2 nucleic acids MAY BE PRESENT.   A presumptive positive result was obtained on the submitted specimen  and confirmed on repeat testing.  While 2019 novel coronavirus  (SARS-CoV-2) nucleic acids may be present in the submitted sample  additional confirmatory testing may be necessary for epidemiological  and / or clinical management purposes  to differentiate between  SARS-CoV-2 and other Sarbecovirus currently known to infect humans.  If clinically indicated additional testing with an alternate test  methodology [id number]) is advised. The SARS-CoV-2 RNA is generally  detectable in upper  and lower respiratory sp ecimens during the acute  phase of infection. The expected result is Negative. Fact Sheet for Patients:  StrictlyIdeas.no Fact Sheet for Healthcare Providers: BankingDealers.co.za This test is not yet approved or cleared by the [place] FDA and has been authorized for detection and/or diagnosis of SARS-CoV-2 by FDA under an Emergency Use Authorization (EUA).  This EUA will remain in effect (meaning this test can be used) for the duration of the COVID-19 declaration under Section 564(b)(1) of the Act, 21 U.S.C. section 360bbb-3(b)(1), unless the authorization is terminated or revoked sooner. Performed at [facility], [place] [address] [place], [place] [address]      Labs: CBC: Recent Labs  Lab 08/11/19 0135 08/12/19 0516  WBC 15.8* 9.7  NEUTROABS 12.3*  --   HGB 11.4* 11.2*  HCT 34.2* 34.7*  MCV 98.8 101.8*  PLT 205 242   Basic Metabolic Panel: Recent Labs  Lab 08/11/19 0135  NA 139  K 3.9  CL 105  CO2 25  GLUCOSE 121*  BUN 15  CREATININE 0.99  CALCIUM 8.5*  MG 1.9   Liver Function  Tests: Recent Labs  Lab 08/11/19 0135  AST 15  ALT 13  ALKPHOS 61  BILITOT 0.7  PROT 5.7*  ALBUMIN 3.3*   Hgb A1c Recent Labs    08/12/19 0516  HGBA1C 5.6   Lipid Profile Recent Labs    08/12/19 0516  CHOL 150  HDL 40*  LDLCALC 93  TRIG 84  CHOLHDL 3.8    Discussed in detail with patient's spouse, updated care and answered questions.  She verbalized understanding.  Time coordinating discharge: 25 minutes  SIGNED:  Vernell Leep, MD, FACP, [facility]. [facility]  To contact the attending provider between 7A-7P or the covering provider during after hours 7P-7A, please log into the web site [email or web address] and access using universal [place] password for that web site. If you do not have the password, please call the hospital operator.

## 2019-08-13 NOTE — Progress Notes (Signed)
Physical Therapy Treatment Patient Details Name: Taylor Salas MRN: [id number] DOB: 01-09-1950 Today's Date: 08/13/2019    History of Present Illness Pt is a 69 y/o male admitted secondary to increased chest pain. Thought to have NSTEMI and is s/p L heart cath. L heart cath revealed nonobstructive CAD. PMH includes HTN, TIA/CVA, colon cancer, and tobacco abuse.     PT Comments    Pt very eager to discharge home today. Pt cognition seems to have improved as is processing is faster, and has better command following. PT believes poor safety awareness is his baseline as he speeds up with his gait when his back pain increases with increasingly unsteady gait. Pt reports he does it frequently especially when getting back to the car from the grocery store. Extensive education on need for either slowing down or sitting down. Pt was mod I for bed mobility, min guard for transfers and min guard progressing to min A for ambulation of 150 feet with RW. PT recommends HHPT to work on balance and endurance.    Follow Up Recommendations  Supervision/Assistance - 24 hour;Home health PT     Equipment Recommendations  Rolling walker with 5" wheels       Precautions / Restrictions Precautions Precautions: Fall Precaution Comments: Pt with near fall during mobility in hall Restrictions Weight Bearing Restrictions: No    Mobility  Bed Mobility Overal bed mobility: Modified Independent             General bed mobility comments: HoB elevated and use of bed rail to pull to EoB  Transfers Overall transfer level: Needs assistance Equipment used: Rolling walker (2 wheeled) Transfers: Sit to/from Stand Sit to Stand: Min guard;From elevated surface         General transfer comment: min guard for safety with power up to RW, vc for hand placement with power up  Ambulation/Gait Ambulation/Gait assistance: Min assist;+2 physical assistance;Min guard Gait Distance (Feet): 150 Feet Assistive device:  Rolling walker (2 wheeled) Gait Pattern/deviations: Step-through pattern;Decreased stride length;Shuffle;Trunk flexed;Antalgic Gait velocity: Decreased  Gait velocity interpretation: <1.8 ft/sec, indicate of risk for recurrent falls General Gait Details: pt initiates gait with only min guard assist for safety for about 100 feet of ambulation, pt with increasingly flexed posture due to back pain, pt gait speed also increases in order "to get back" pt requiring min A for steadying with RW and vc for slowing gait. offered to sit numerous time but pt wanted to progress gait. Pt able to make it back to room before sitting. Pt educated in the danger of speeding up when he is feeling back pain as he get more unsteady       Balance Overall balance assessment: Needs assistance Sitting-balance support: No upper extremity supported;Feet supported Sitting balance-Leahy Scale: Fair     Standing balance support: Bilateral upper extremity supported;During functional activity Standing balance-Leahy Scale: Fair Standing balance comment: able to statically stand without assist requires UE support for dynamic activity                             Cognition Arousal/Alertness: Suspect due to medications Behavior During Therapy: WFL for tasks assessed/performed Overall Cognitive Status: Impaired/Different from baseline Area of Impairment: Problem solving;Safety/judgement;Following commands                       Following Commands: Follows multi-step commands inconsistently Safety/Judgement: Decreased awareness of safety;Decreased awareness of deficits   Problem  Solving: Slow processing General Comments: Pt with poor safety awareness, speeding up with increasingly unsteady gait with onset of back pain             Pertinent Vitals/Pain Pain Assessment: Faces Faces Pain Scale: Hurts even more Pain Location: back Pain Descriptors / Indicators: Aching Pain Intervention(s): Limited  activity within patient's tolerance;Monitored during session;Repositioned           PT Goals (current goals can now be found in the care plan section) Acute Rehab PT Goals Patient Stated Goal: for pt to be able to walk better prior to return home PT Goal Formulation: With patient/family Time For Goal Achievement: 08/26/19 Potential to Achieve Goals: Fair Progress towards PT goals: Progressing toward goals    Frequency    Min 3X/week      PT Plan Discharge plan needs to be updated       AM-PAC PT "6 Clicks" Mobility   Outcome Measure  Help needed turning from your back to your side while in a flat bed without using bedrails?: A Little Help needed moving from lying on your back to sitting on the side of a flat bed without using bedrails?: A Little Help needed moving to and from a bed to a chair (including a wheelchair)?: A Little Help needed standing up from a chair using your arms (e.g., wheelchair or bedside chair)?: A Little Help needed to walk in hospital room?: A Little Help needed climbing 3-5 steps with a railing? : Total 6 Click Score: 16    End of Session Equipment Utilized During Treatment: Gait belt Activity Tolerance: Patient limited by pain Patient left: in chair;with call bell/phone within reach;with family/visitor present Nurse Communication: Mobility status PT Visit Diagnosis: Difficulty in walking, not elsewhere classified (R26.2);Unsteadiness on feet (R26.81);Muscle weakness (generalized) (M62.81)     Time: 7829-5621 PT Time Calculation (min) (ACUTE ONLY): 19 min  Charges:  $Gait Training: 8-22 mins                     Arlicia Paquette B. Migdalia Dk PT, DPT Acute Rehabilitation Services Pager [phone] Office [phone]    [place] 08/13/2019, 3:03 PM

## 2019-08-14 NOTE — TOC Transition Note (Signed)
Transition of Care [facility]) - CM/SW Discharge Note   Patient Details  Name: Taylor Salas MRN: [id number] Date of Birth: Jul 05, 1950  [facility] CM/SW Contact:  Bethena Roys, RN Phone Number: 08/14/2019, 9:58 AM   Clinical Narrative:   Pt was in a hurry to leave on 08-13-19 . CM was unable to provide patient with an agency to service him for PT. CM was able to find an agency willing to see him with [facility] on Monday. Patient and wife agreeable to services start time. Information sent to [facility]. No further needs from CM at this time.     Barriers to Discharge: No Barriers Identified   Patient Goals and CMS Choice Patient states their goals for this hospitalization and ongoing recovery are:: "to return home" CMS Medicare.gov Compare Post Acute Care list provided to:: Patient Choice offered to / list presented to : Patient  Discharge Placement    Discharge Plan and Services In-house Referral: NA Discharge Planning Services: CM Consult Post Acute Care Choice: Home Health           HH Arranged: PT   Social Determinants of Health (SDOH) Interventions     Readmission Risk Interventions No flowsheet data found.

## 2019-08-14 NOTE — Care Management (Signed)
1604 08-14-19 [facility] was unable to accept the patients insurance. Patient will not have any services for [facility] PT at this time. CM did attempt to call the PCPs office to ask the PCP for Outpatient PT order- unable to get the provider. CM did call the wife. Patient has PCP appointment on Monday and wife will get outpatient PT orders. CM will continue to monitor for additional transition of care needs. Bethena Roys, RN, BSN Case Manager [phone]

## 2019-09-13 DIAGNOSIS — R079 Chest pain, unspecified: Secondary | ICD-10-CM

## 2019-09-13 DIAGNOSIS — E785 Hyperlipidemia, unspecified: Secondary | ICD-10-CM

## 2019-09-13 DIAGNOSIS — I10 Essential (primary) hypertension: Secondary | ICD-10-CM | POA: Diagnosis not present

## 2019-09-13 DIAGNOSIS — J449 Chronic obstructive pulmonary disease, unspecified: Secondary | ICD-10-CM | POA: Diagnosis not present

## 2019-09-13 DIAGNOSIS — Z8673 Personal history of transient ischemic attack (TIA), and cerebral infarction without residual deficits: Secondary | ICD-10-CM | POA: Diagnosis not present

## 2019-09-13 DIAGNOSIS — Z720 Tobacco use: Secondary | ICD-10-CM

## 2019-09-14 DIAGNOSIS — Z8673 Personal history of transient ischemic attack (TIA), and cerebral infarction without residual deficits: Secondary | ICD-10-CM | POA: Diagnosis not present

## 2019-09-14 DIAGNOSIS — I10 Essential (primary) hypertension: Secondary | ICD-10-CM | POA: Diagnosis not present

## 2019-09-14 DIAGNOSIS — R079 Chest pain, unspecified: Secondary | ICD-10-CM | POA: Diagnosis not present

## 2019-09-14 DIAGNOSIS — J449 Chronic obstructive pulmonary disease, unspecified: Secondary | ICD-10-CM | POA: Diagnosis not present

## 2019-10-04 ENCOUNTER — Emergency Department (HOSPITAL_COMMUNITY): Payer: Medicare Other

## 2019-10-04 ENCOUNTER — Other Ambulatory Visit: Payer: Self-pay

## 2019-10-04 ENCOUNTER — Encounter (HOSPITAL_COMMUNITY): Payer: Self-pay

## 2019-10-04 ENCOUNTER — Emergency Department (HOSPITAL_COMMUNITY)
Admission: EM | Admit: 2019-10-04 | Discharge: 2019-10-04 | Disposition: A | Discharge status: Home / Self Care | Payer: Medicare Other | Attending: Emergency Medicine | Admitting: Emergency Medicine

## 2019-10-04 DIAGNOSIS — R1084 Generalized abdominal pain: Secondary | ICD-10-CM | POA: Diagnosis not present

## 2019-10-04 DIAGNOSIS — I10 Essential (primary) hypertension: Secondary | ICD-10-CM | POA: Diagnosis not present

## 2019-10-04 DIAGNOSIS — F17210 Nicotine dependence, cigarettes, uncomplicated: Secondary | ICD-10-CM | POA: Diagnosis not present

## 2019-10-04 DIAGNOSIS — R109 Unspecified abdominal pain: Secondary | ICD-10-CM | POA: Diagnosis present

## 2019-10-04 DIAGNOSIS — R197 Diarrhea, unspecified: Secondary | ICD-10-CM | POA: Diagnosis not present

## 2019-10-04 LAB — COMPREHENSIVE METABOLIC PANEL
ALT: 18 U/L (ref 0–44)
AST: 19 U/L (ref 15–41)
Albumin: 3.7 g/dL (ref 3.5–5.0)
Alkaline Phosphatase: 77 U/L (ref 38–126)
Anion gap: 6 (ref 5–15)
BUN: 11 mg/dL (ref 8–23)
CO2: 23 mmol/L (ref 22–32)
Calcium: 9.3 mg/dL (ref 8.9–10.3)
Chloride: 109 mmol/L (ref 98–111)
Creatinine, Ser: 0.95 mg/dL (ref 0.61–1.24)
GFR calc Af Amer: >60 mL/min (ref >60)
GFR calc non Af Amer: >60 mL/min (ref >60)
Glucose, Bld: 103 mg/dL — ABNORMAL HIGH (ref 70–99)
Potassium: 4 mmol/L (ref 3.5–5.1)
Sodium: 138 mmol/L (ref 135–145)
Total Bilirubin: 0.2 mg/dL — ABNORMAL LOW (ref 0.3–1.2)
Total Protein: 6.7 g/dL (ref 6.5–8.1)

## 2019-10-04 LAB — CBC
HCT: 38.9 % — ABNORMAL LOW (ref 39.0–52.0)
Hemoglobin: 12.8 g/dL — ABNORMAL LOW (ref 13.0–17.0)
MCH: 32.7 pg (ref 26.0–34.0)
MCHC: 32.9 g/dL (ref 30.0–36.0)
MCV: 99.5 fL (ref 80.0–100.0)
Platelets: 271 10*3/uL (ref 150–400)
RBC: 3.91 MIL/uL — ABNORMAL LOW (ref 4.22–5.81)
RDW: 13.6 % (ref 11.5–15.5)
WBC: 10.6 10*3/uL — ABNORMAL HIGH (ref 4.0–10.5)
nRBC: 0 % (ref 0.0–0.2)

## 2019-10-04 LAB — LIPASE, BLOOD: Lipase: 34 U/L (ref 11–51)

## 2019-10-04 MED ORDER — SODIUM CHLORIDE 0.9% FLUSH: 3.0000 mL | Freq: Once | INTRAVENOUS | Status: DC

## 2019-10-04 MED ORDER — FENTANYL CITRATE (PF) 100 MCG/2ML IJ SOLN
50.0000 ug | Freq: Once | INTRAMUSCULAR | Status: AC
  Administered 2019-10-04: 21:00:00 50 ug via INTRAVENOUS
  Filled 2019-10-04: qty 2

## 2019-10-04 MED ORDER — SODIUM CHLORIDE (PF) 0.9 % IJ SOLN
INTRAMUSCULAR | Status: AC
  Filled 2019-10-04: qty 50

## 2019-10-04 MED ORDER — IOHEXOL 300 MG/ML  SOLN
100.0000 mL | Freq: Once | INTRAMUSCULAR | Status: AC | PRN
  Administered 2019-10-04: 21:00:00 100 mL via INTRAVENOUS

## 2019-10-04 MED ORDER — DICYCLOMINE HCL 20 MG PO TABS: 20.0000 mg | ORAL_TABLET | Freq: Two times a day (BID) | ORAL | 0 refills | Status: AC

## 2019-10-04 MED ORDER — SODIUM CHLORIDE 0.9 % IV BOLUS
1000.0000 mL | Freq: Once | INTRAVENOUS | Status: AC
  Administered 2019-10-04: 21:00:00 1000 mL via INTRAVENOUS

## 2019-10-04 MED ORDER — ONDANSETRON 4 MG PO TBDP: 4.0000 mg | ORAL_TABLET | Freq: Three times a day (TID) | ORAL | 0 refills | Status: AC | PRN

## 2019-10-04 NOTE — ED Notes (Signed)
Pt's visitor found smoking at bedside with pt in room. Explained to visitor that she could not smoke in the room as it was a smoke free campus and it was very dangerous to smoke around oxygen. Pt's visitor agreed to not smoke in the room anymore, and if she was found smoking again, she would be asked to leave. Security aware.

## 2019-10-04 NOTE — Discharge Instructions (Addendum)
You were evaluated in the Emergency Department and after careful evaluation, we did not find any emergent condition requiring admission or further testing in the hospital.  Your exam/testing today was overall reassuring.  Please keep your follow-up with your GI specialist on Wednesday and use the pain/nausea medications provided as needed.  Please return to the Emergency Department if you experience any worsening of your condition.  We encourage you to follow up with a primary care provider.  Thank you for allowing [place] to be a part of your care.

## 2019-10-04 NOTE — ED Provider Notes (Addendum)
[facility] Emergency Department Provider Note MRN:  [id number]  Arrival date & time: 10/04/19     Chief Complaint   Abdominal Pain and Diarrhea   History of Present Illness   Taylor Salas is a 69 y.o. year-old male with a history of CVA, colon cancer presenting to the ED with chief complaint of abdominal pain and diarrhea.  1 week of diffuse abdominal pain, moderate in severity, no exacerbating relieving factors.  Associated with watery diarrhea, no blood in the stool.  No recent antibiotics.  Denies fever, no chest pain or shortness of breath, no dysuria.  Review of Systems  A complete 10 system review of systems was obtained and all systems are negative except as noted in the HPI and PMH.   Patient's Health History    Past Medical History:  Diagnosis Date  . Colon cancer ([place])   . CVA (cerebral vascular accident) ([place])   . Depression 08/11/2019  . Essential hypertension 08/11/2019  . Hyperlipidemia   . Tobacco abuse     Past Surgical History:  Procedure Laterality Date  . COLON RESECTION     resection of colon cancer - unknown location  . LEFT HEART CATH AND CORONARY ANGIOGRAPHY N/A 08/11/2019   Procedure: LEFT HEART CATH AND CORONARY ANGIOGRAPHY;  Surgeon: Jettie Booze, MD;  Location: [facility];  Service: Cardiovascular;  Laterality: N/A;    Family History  Problem Relation Age of Onset  . COPD Mother   . Heart attack Father        Died in late 70s-early 70s    Social History   Socioeconomic History  . Marital status: Married    Spouse name: Not on file  . Number of children: Not on file  . Years of education: Not on file  . Highest education level: Not on file  Occupational History  . Occupation: Theme park manager at a [employer]  . Financial resource strain: Not on file  . Food insecurity    Worry: Not on file    Inability: Not on file  . Transportation needs    Medical: Not on file    Non-medical: Not on  file  Tobacco Use  . Smoking status: Current Every Day Smoker    Packs/day: 0.50  . Smokeless tobacco: Never Used  Substance and Sexual Activity  . Alcohol use: Not Currently  . Drug use: Not Currently  . Sexual activity: Not on file  Lifestyle  . Physical activity    Days per week: Not on file    Minutes per session: Not on file  . Stress: Not on file  Relationships  . Social Herbalist on phone: Not on file    Gets together: Not on file    Attends religious service: Not on file    Active member of club or organization: Not on file    Attends meetings of clubs or organizations: Not on file    Relationship status: Not on file  . Intimate partner violence    Fear of current or ex partner: Not on file    Emotionally abused: Not on file    Physically abused: Not on file    Forced sexual activity: Not on file  Other Topics Concern  . Not on file  Social History Narrative  . Not on file     Physical Exam  Vital Signs and Nursing Notes reviewed Vitals:   10/04/19 1751 10/04/19 2130  BP: Marland Kitchen)  192/107 (!) 199/104  Pulse: 82 66  Resp: 20 19  Temp: 98.7 F (37.1 C)   SpO2: 100% 97%    CONSTITUTIONAL: Chronically ill-appearing, NAD NEURO:  Alert and oriented x 3, no focal deficits EYES:  eyes equal and reactive ENT/NECK:  no LAD, no JVD CARDIO: Regular rate, well-perfused, normal S1 and S2 PULM:  CTAB no wheezing or rhonchi GI/GU:  normal bowel sounds, non-distended, moderate diffuse tenderness MSK/SPINE:  No gross deformities, no edema SKIN:  no rash, atraumatic PSYCH:  Appropriate speech and behavior  Diagnostic and Interventional Summary    Labs Reviewed  COMPREHENSIVE METABOLIC PANEL - Abnormal; Notable for the following components:      Result Value   Glucose, Bld 103 (*)    Total Bilirubin 0.2 (*)    All other components within normal limits  CBC - Abnormal; Notable for the following components:   WBC 10.6 (*)    RBC 3.91 (*)    Hemoglobin 12.8  (*)    HCT 38.9 (*)    All other components within normal limits  LIPASE, BLOOD  URINALYSIS, ROUTINE W REFLEX MICROSCOPIC    CT ABDOMEN PELVIS W CONTRAST  Final Result      Medications  sodium chloride flush (NS) 0.9 % injection 3 mL (0 mLs Intravenous Hold 10/04/19 2022)  sodium chloride (PF) 0.9 % injection (0 mLs  Hold 10/04/19 2138)  sodium chloride 0.9 % bolus 1,000 mL (1,000 mLs Intravenous New Bag/Given 10/04/19 2057)  fentaNYL (SUBLIMAZE) injection 50 mcg (50 mcg Intravenous Given 10/04/19 2056)  iohexol (OMNIPAQUE) 300 MG/ML solution 100 mL (100 mLs Intravenous Contrast Given 10/04/19 2108)     Procedures Critical Care  ED Course and Medical Decision Making  I have reviewed the triage vital signs and the nursing notes.  Pertinent labs & imaging results that were available during my care of the patient were reviewed by me and considered in my medical decision making (see below for details).  Given patient's history of cancer, complex abdominal surgery history, will CT to exclude partial obstruction, colitis.  Labs are reassuring, awaiting CT.  Anticipating discharge.  10 PM update: CT scan is without acute findings.  Patient is comfortable appearing and is appropriate for discharge with continued symptomatic management, no indication for antibiotics, patient has close follow-up with GI specialist in 3 days.  Barth Kirks. Sedonia Small, [place] [email or web address]  Final Clinical Impressions(s) / ED Diagnoses     ICD-10-CM   1. Diarrhea of presumed infectious origin  R19.7   2. Generalized abdominal pain  R10.84     ED Discharge Orders    None      Discharge Instructions Discussed with and Provided to Patient: Discharge Instructions   None       Maudie Flakes, MD 10/04/19 2145    Maudie Flakes, MD 10/04/19 2200

## 2019-10-04 NOTE — ED Triage Notes (Signed)
Pt states that he has been having generalized abd pain x 1 week. Pt states he has been having frequent BMs that smell bad and are "hairy". Pt states that he had a colonoscopy and endoscopy done last month, and had "4 bad spots". Pt states he has hx of a "bad e coli infection".

## 2019-10-04 NOTE — ED Notes (Signed)
Pt ambulatory with cane from room to BR with steady gait.

## 2020-05-28 DIAGNOSIS — J441 Chronic obstructive pulmonary disease with (acute) exacerbation: Secondary | ICD-10-CM

## 2020-05-28 DIAGNOSIS — J189 Pneumonia, unspecified organism: Secondary | ICD-10-CM

## 2020-05-28 DIAGNOSIS — R509 Fever, unspecified: Secondary | ICD-10-CM | POA: Diagnosis not present

## 2020-05-28 DIAGNOSIS — D72829 Elevated white blood cell count, unspecified: Secondary | ICD-10-CM

## 2020-05-29 DIAGNOSIS — R509 Fever, unspecified: Secondary | ICD-10-CM | POA: Diagnosis not present

## 2020-05-29 DIAGNOSIS — J189 Pneumonia, unspecified organism: Secondary | ICD-10-CM | POA: Diagnosis not present

## 2020-05-29 DIAGNOSIS — D72829 Elevated white blood cell count, unspecified: Secondary | ICD-10-CM | POA: Diagnosis not present

## 2020-05-29 DIAGNOSIS — J441 Chronic obstructive pulmonary disease with (acute) exacerbation: Secondary | ICD-10-CM | POA: Diagnosis not present

## 2020-05-30 DIAGNOSIS — D72829 Elevated white blood cell count, unspecified: Secondary | ICD-10-CM | POA: Diagnosis not present

## 2020-05-30 DIAGNOSIS — R509 Fever, unspecified: Secondary | ICD-10-CM | POA: Diagnosis not present

## 2020-05-30 DIAGNOSIS — J189 Pneumonia, unspecified organism: Secondary | ICD-10-CM | POA: Diagnosis not present

## 2020-05-30 DIAGNOSIS — J441 Chronic obstructive pulmonary disease with (acute) exacerbation: Secondary | ICD-10-CM | POA: Diagnosis not present

## 2023-01-31 DEATH — deceased
